# Patient Record
Sex: Female | Born: 1945 | Race: White | Hispanic: No | Smoking: Current every day smoker
Health system: Southern US, Academic
[De-identification: ages and names within clinical notes are randomized; demographics above are authoritative.]

## PROBLEM LIST (undated history)

## (undated) DIAGNOSIS — M549 Dorsalgia, unspecified: Secondary | ICD-10-CM

## (undated) DIAGNOSIS — E782 Mixed hyperlipidemia: Secondary | ICD-10-CM

## (undated) DIAGNOSIS — I739 Peripheral vascular disease, unspecified: Secondary | ICD-10-CM

## (undated) DIAGNOSIS — M7062 Trochanteric bursitis, left hip: Secondary | ICD-10-CM

## (undated) DIAGNOSIS — G8929 Other chronic pain: Secondary | ICD-10-CM

## (undated) DIAGNOSIS — M7061 Trochanteric bursitis, right hip: Secondary | ICD-10-CM

## (undated) HISTORY — DX: Peripheral vascular disease, unspecified (CMS HCC): I73.9

## (undated) HISTORY — PX: HX APPENDECTOMY: SHX54

## (undated) HISTORY — DX: Trochanteric bursitis, right hip: M70.61

## (undated) HISTORY — PX: ESOPHAGOGASTRODUODENOSCOPY: SHX1529

## (undated) HISTORY — DX: Dorsalgia, unspecified: M54.9

## (undated) HISTORY — DX: Mixed hyperlipidemia: E78.2

## (undated) HISTORY — PX: BREAST SURGERY: SHX581

## (undated) HISTORY — DX: Other chronic pain: G89.29

## (undated) HISTORY — PX: HX TUMOR REMOVAL: SHX12

## (undated) HISTORY — DX: Trochanteric bursitis, left hip: M70.62

## (undated) HISTORY — PX: SHOULDER SURGERY: SHX246

---

## 1993-04-22 ENCOUNTER — Other Ambulatory Visit (HOSPITAL_COMMUNITY): Payer: Self-pay

## 2010-03-28 IMAGING — US CV
1 series · 14 of 16 positions shown · non-contrast
Comparison: none

Picasso, Thuy

EXAM:
BILATERAL CAROTID ULTRASOUND
HISTORY: Dizziness

[Series 1: cv · 0.06mm/px · 14 of 58 slices shown]
[im 1/58]
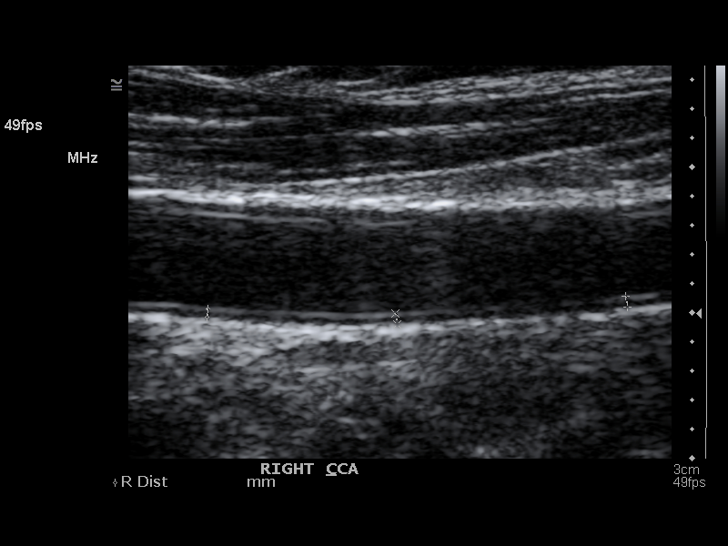
[im 4/58]
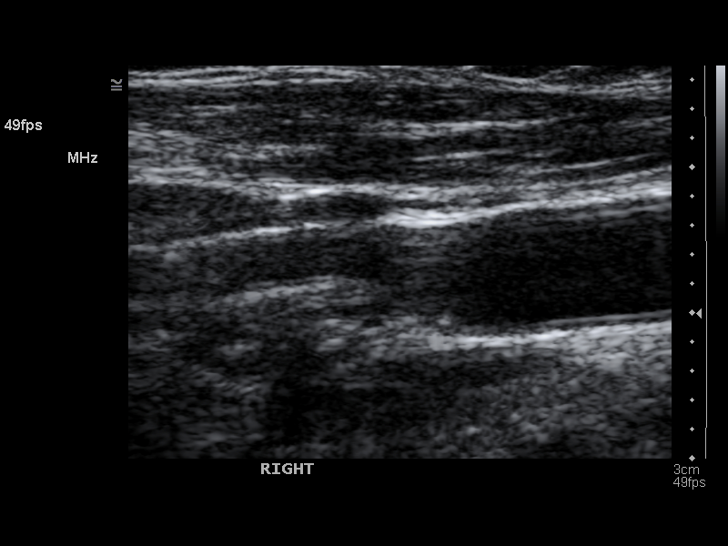
[im 8/58]
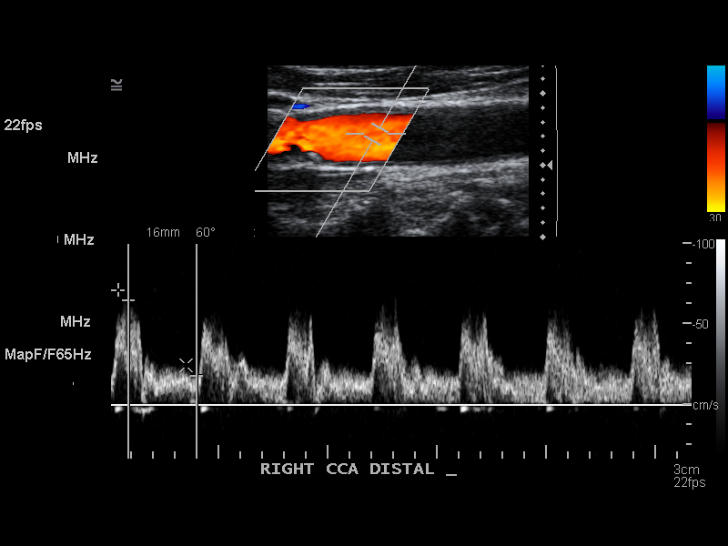
[im 16/58]
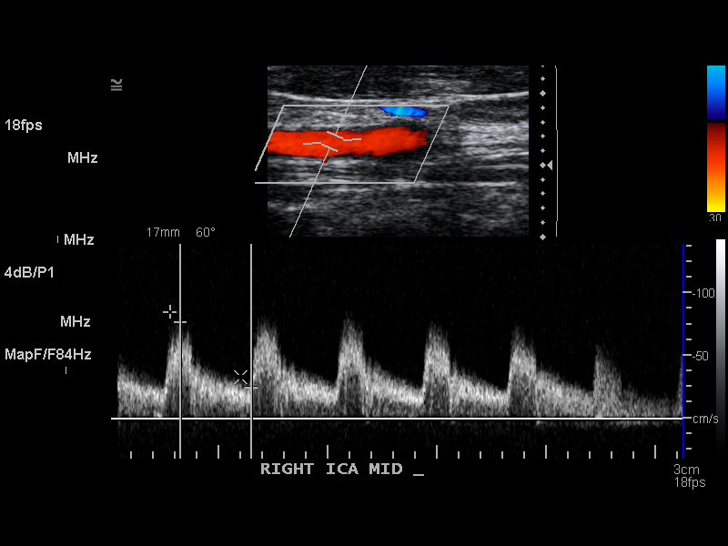
[im 20/58]
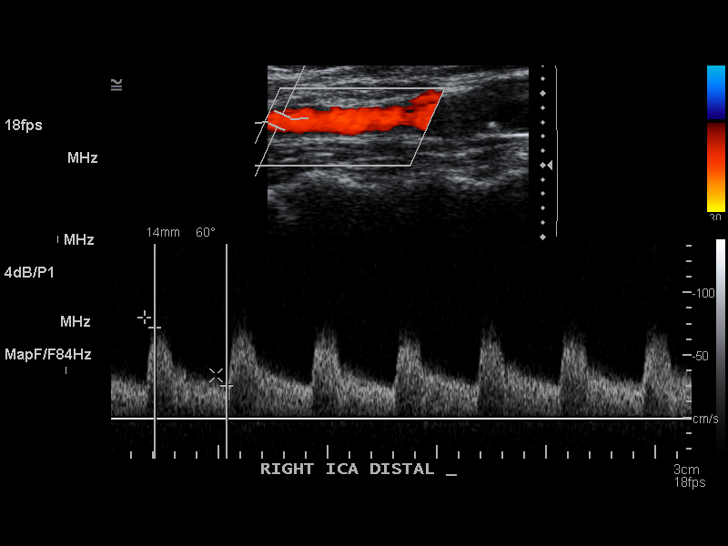
[im 23/58]
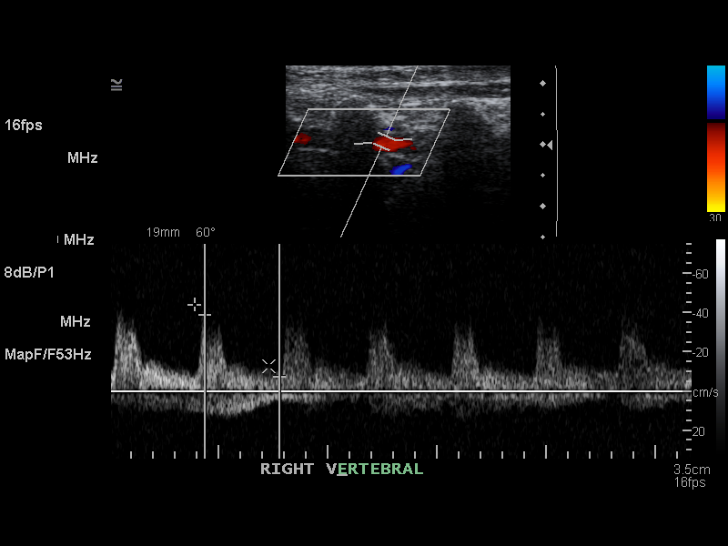
[im 27/58]
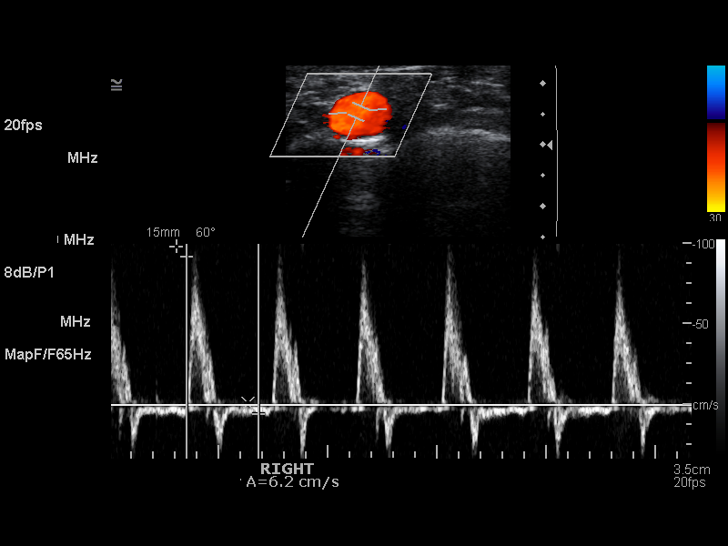
[im 31/58]
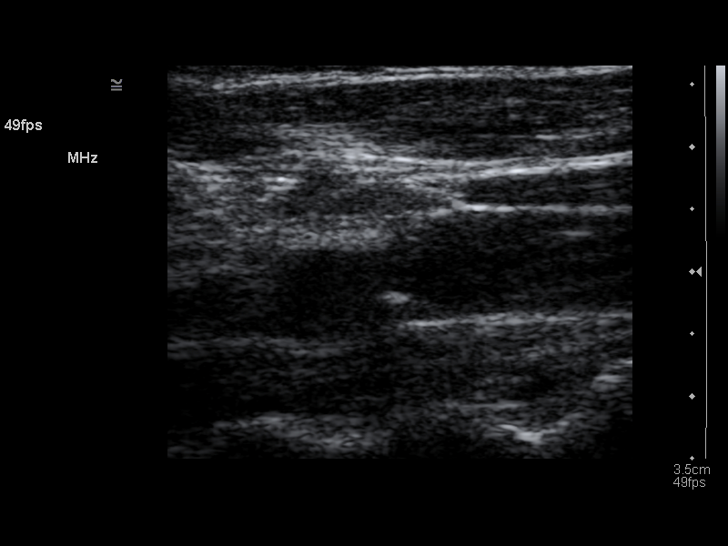
[im 35/58]
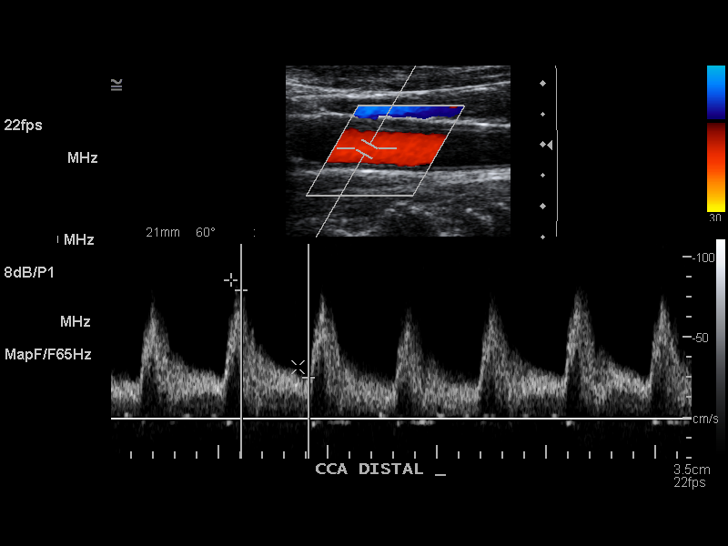
[im 39/58]
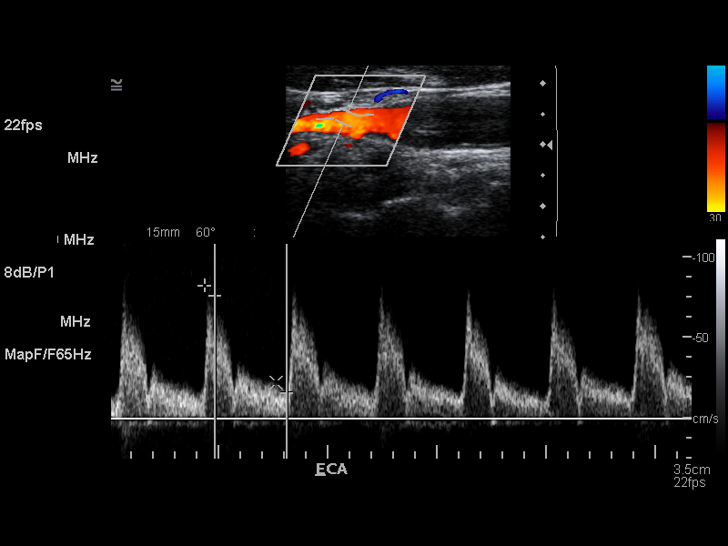
[im 46/58]
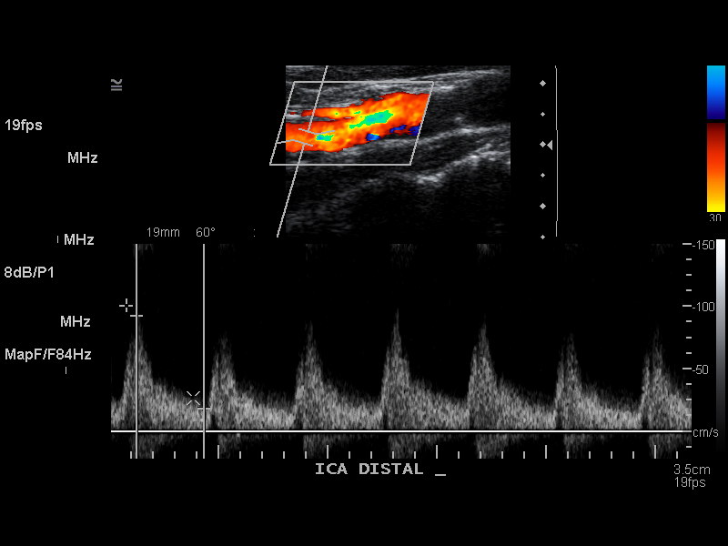
[im 50/58]
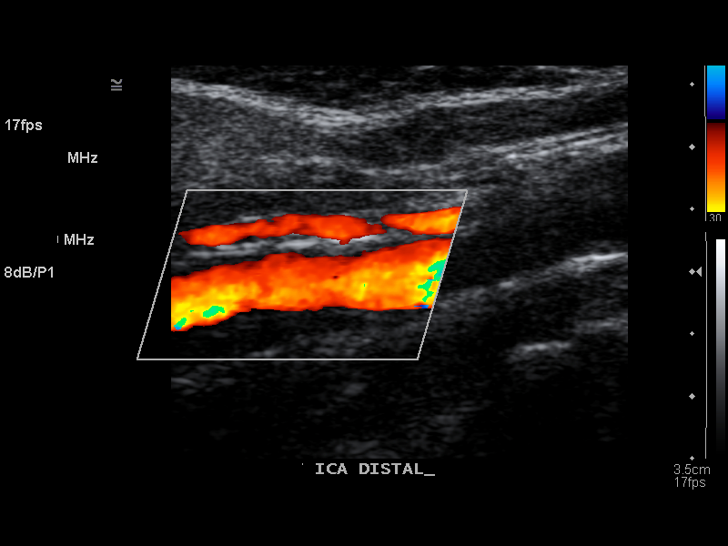
[im 54/58]
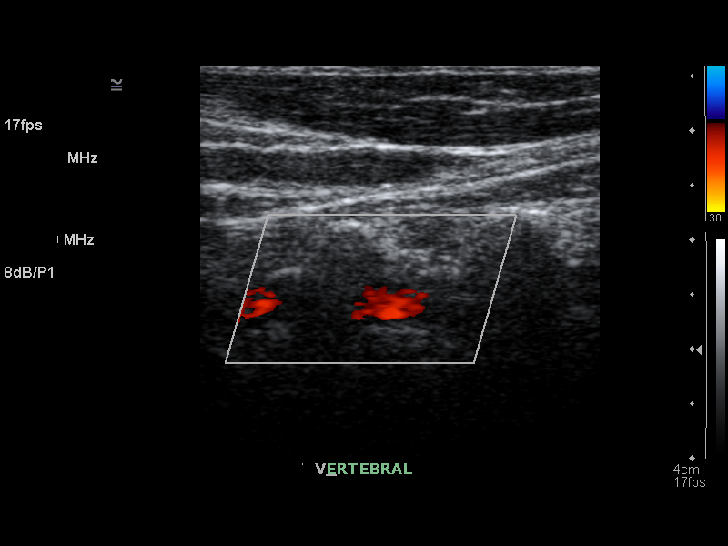
[im 58/58]
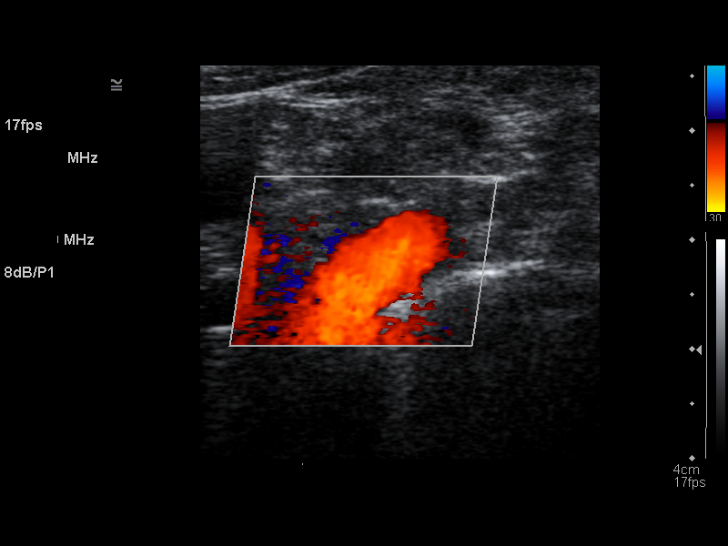

[14 of 16 positions shown; findings below may reference images not displayed]

FINDINGS: Examination of the carotid arteries reveals a small amount of heterogeneous plaque in the region of the carotid bifurcations bilaterally. However, no significant associated stenosis or flow velocity elevation is seen on either side. Antegrade flow is noted within the vertebral arteries bilaterally.
IMPRESSION: 1.  Mild atherosclerotic disease. 
2.  No significant carotid stenosis.  

________________________________

## 2012-04-12 IMAGING — MG MAMMO SCREEN W CAD
1 series · 5 of 5 positions shown · non-contrast
Comparison: Previous outside studies dated 12/16/07 and 03/15/10.

Higino, Orlando Rodrigues

Exam:
Bilateral digital screening mammogram with CAD
HISTORY: Asymptomatic 66 year old with family history of breast cancer in her sister. Lifetime breast cancer risk is calculated at 14% compared with 8% in control group.

[Series 2: R CC · right · 5 of 5 slices shown]
[im 1/5]
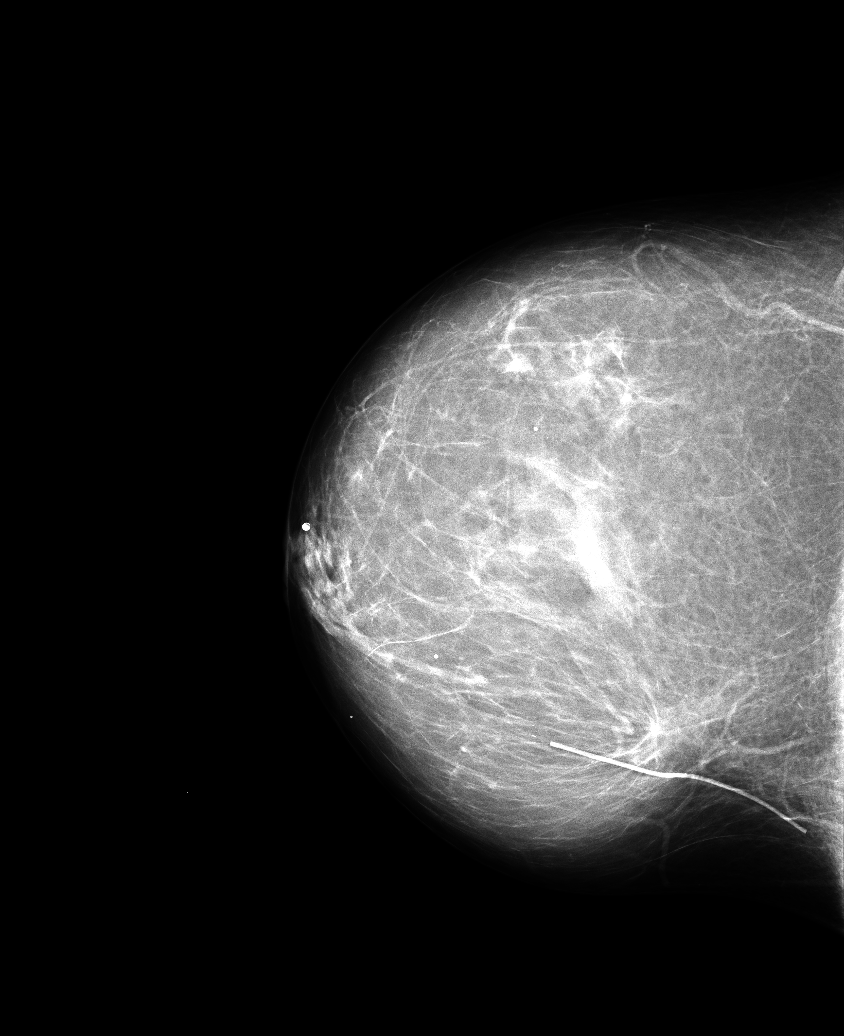
[im 2/5]
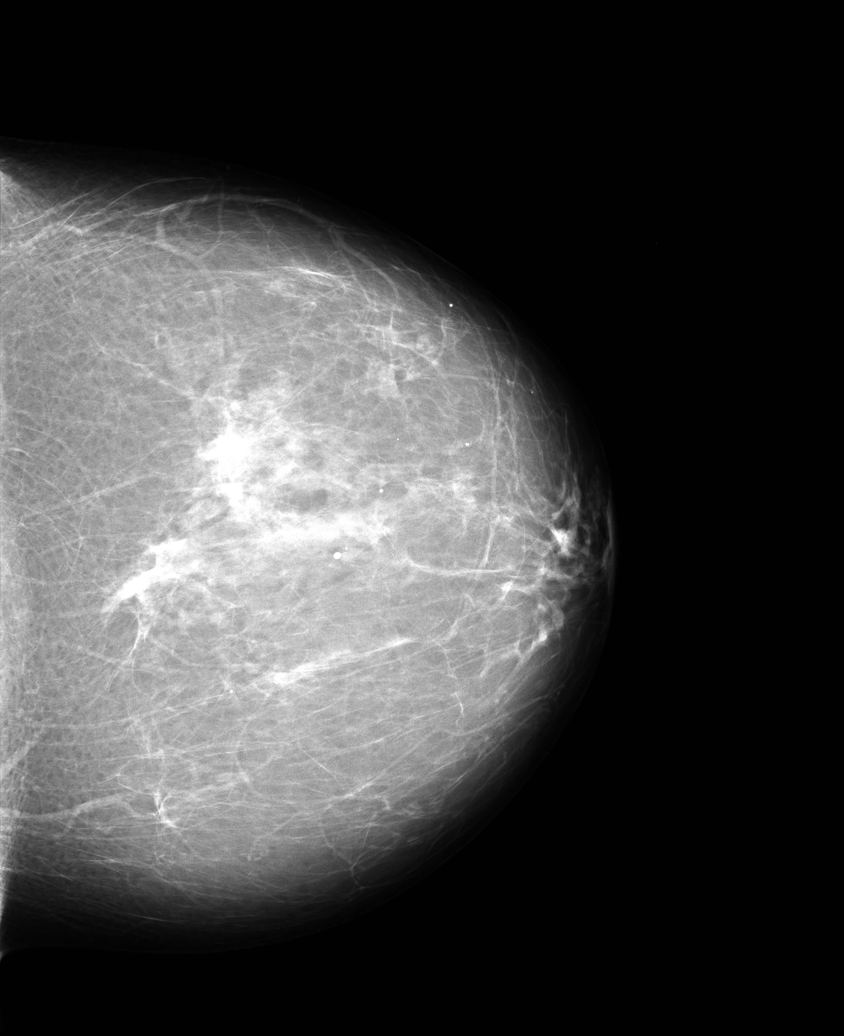
[im 3/5]
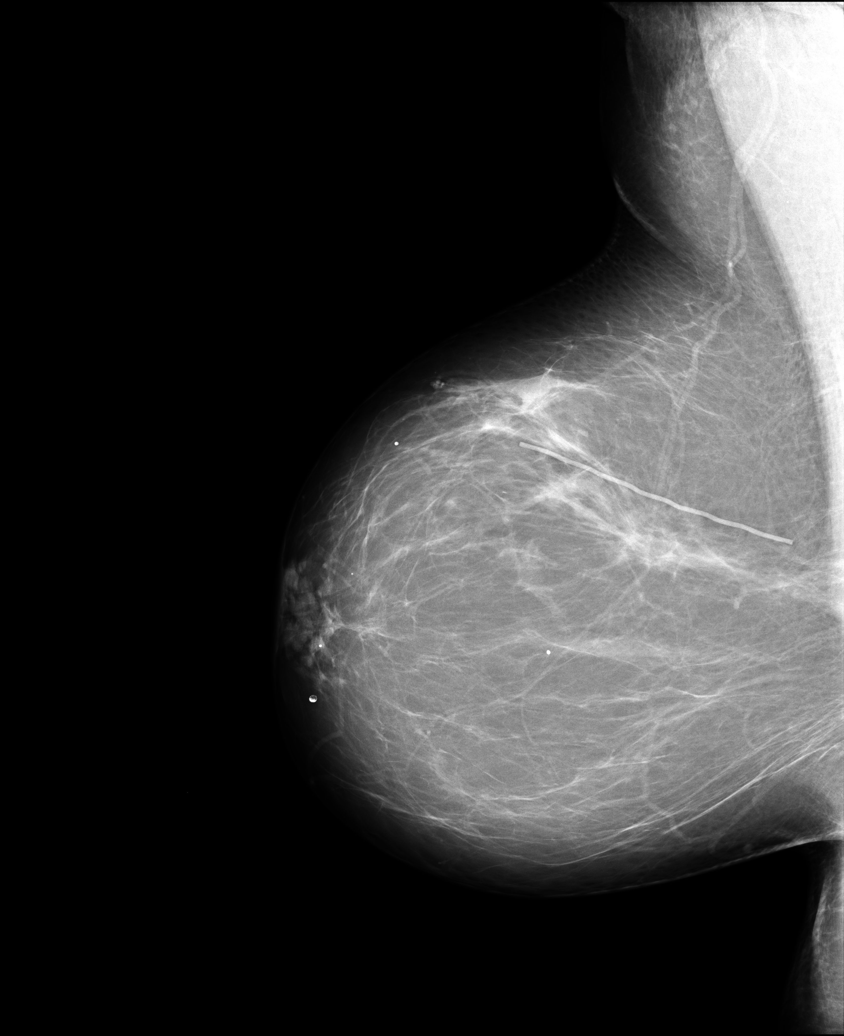
[im 4/5]
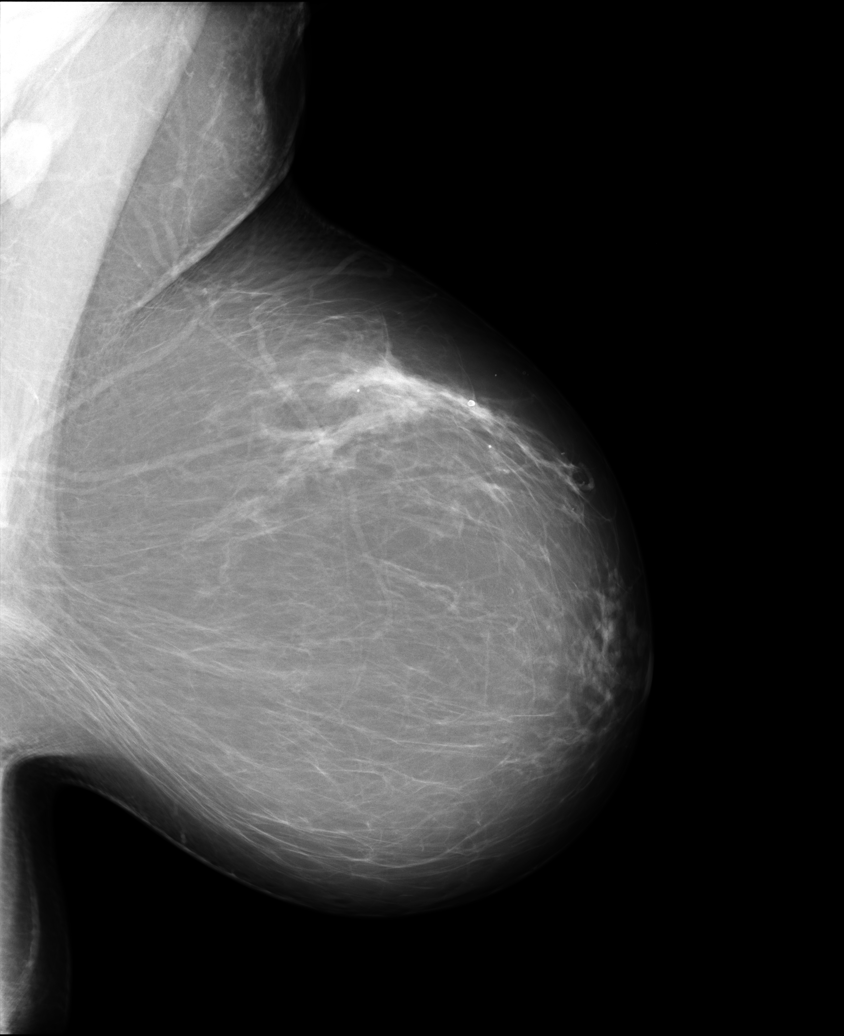
[im 5/5]
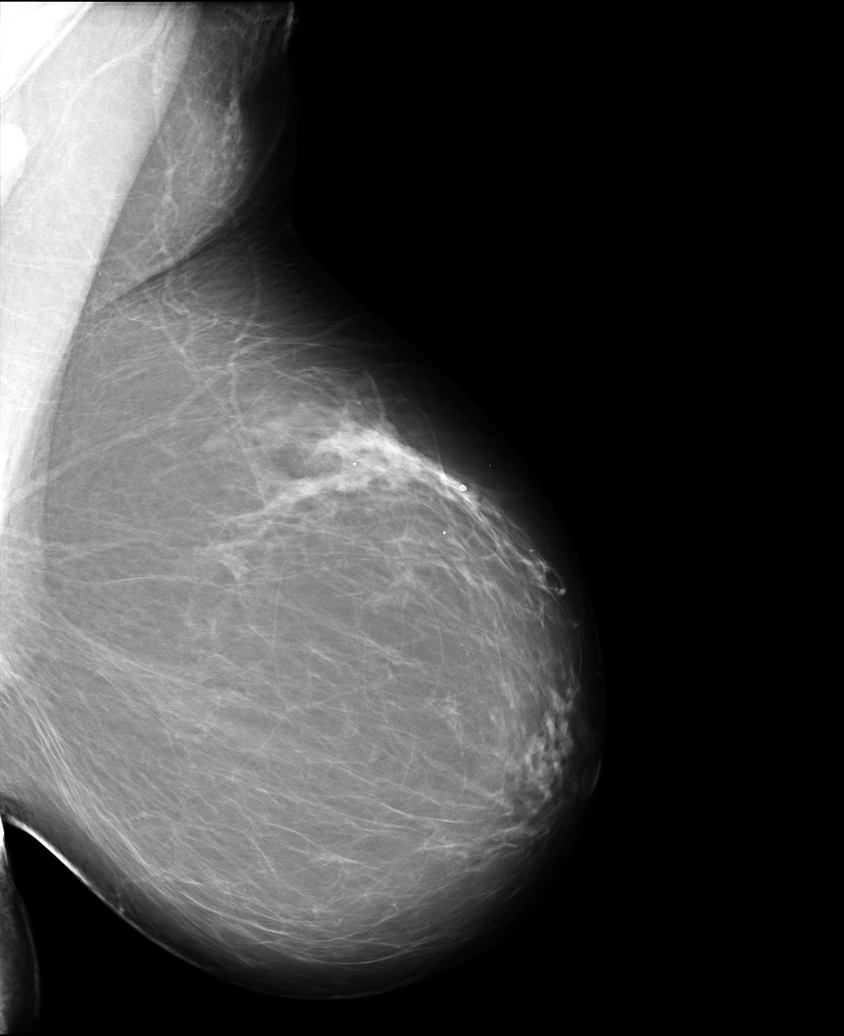

[5 of 5 positions shown; findings below may reference images not displayed]

FINDINGS: Biopsy changes of the medial right breast are noted with no recurrent masses. No abnormal calcific density, skin changes or nipple changes are seen. 

NOTE:

In compliance with Federal regulations, the results of this mammogram are being sent to the patient.
IMPRESSION: Benign mammographic findings with post biopsy changes of the right breast. 
Clinical follow up and mammographic follow up are recommended at twelve months. 
Final Assessment Code:
Bi-Rads 2 

BI-RADS 0
Need additional imaging evaluation
BI-RADS 1
Negative mammogram
BI-RADS 2
Benign finding
BI-RADS 3
Probably benign finding: short-interval follow-up suggested
BI-RADS 4
Suspicious abnormality:  biopsy should be considered
BI-RADS 5
Highly suggestive of malignancy; appropriate action should be taken

________________________________
Amnon Tiger., signed this document electronically

## 2012-05-22 IMAGING — CR CSP
1 series · 5 of 5 positions shown · non-contrast
Comparison: None.

XRay Cervical Sp.2-3 Views BERTSCH

Dhenazta, Nhamuk
Exam:
Cervical Spine 3V
INDICATION: Left arm pain.

[Series 1: view not recorded · 0.17mm/px · 5 of 5 slices shown]
[im 1/5]
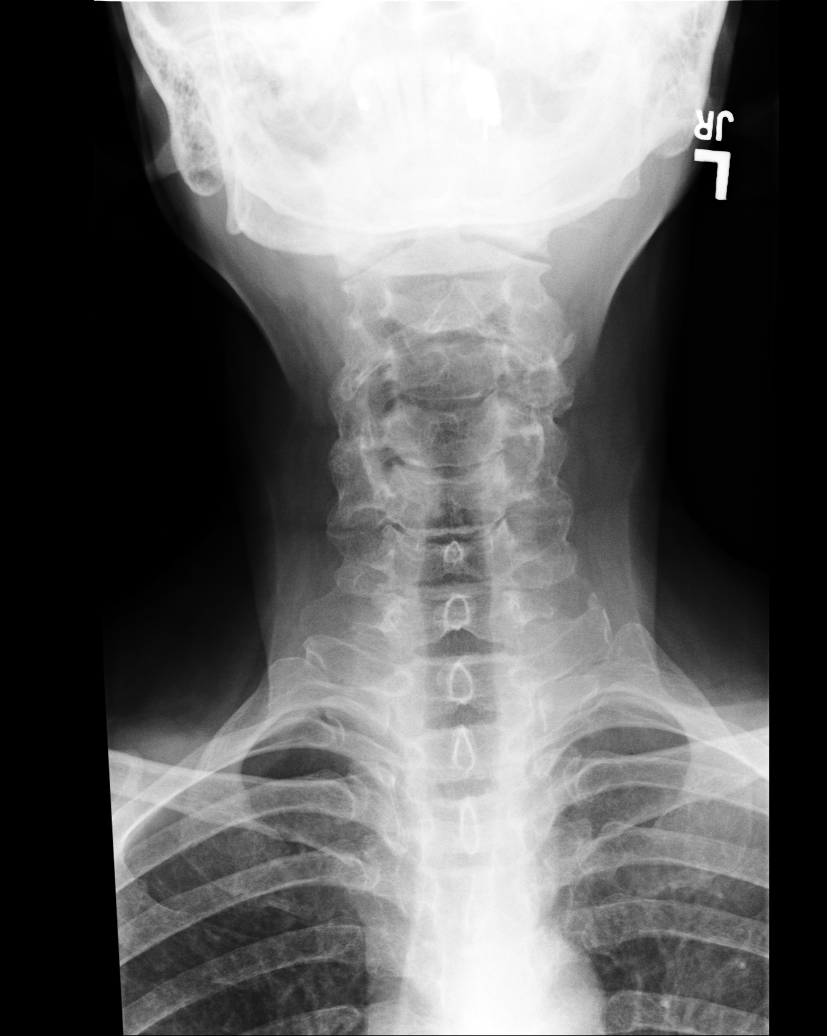
[im 2/5]
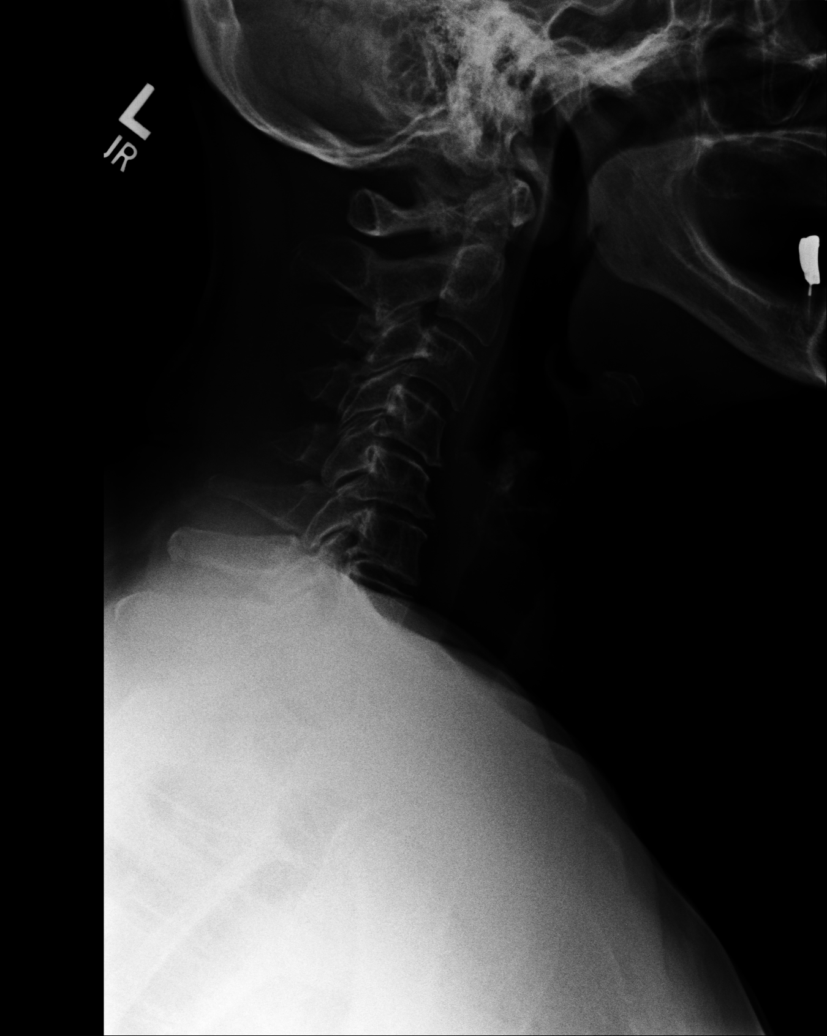
[im 3/5]
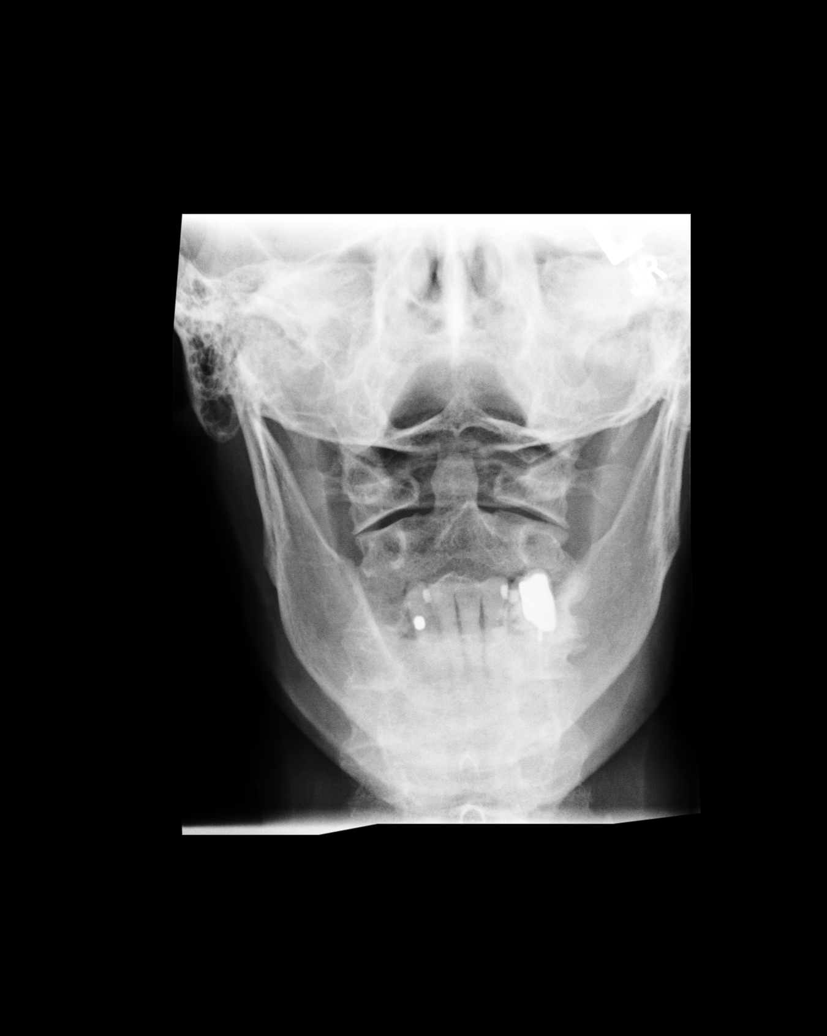
[im 4/5]
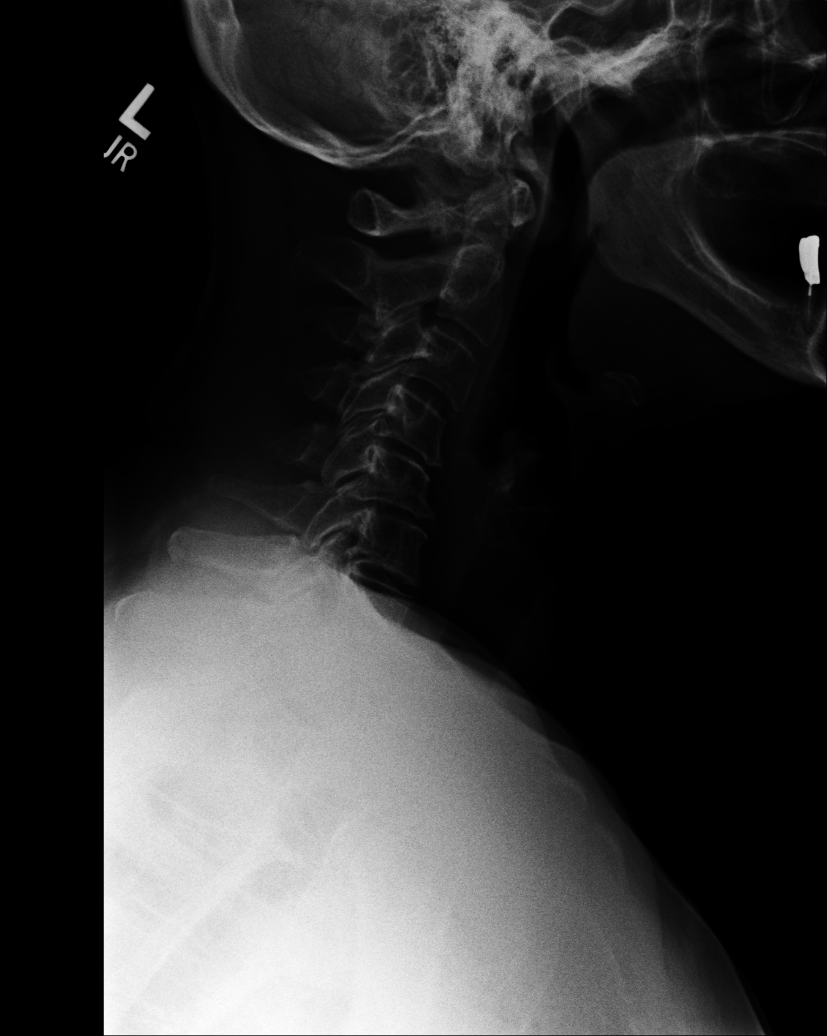
[im 5/5]
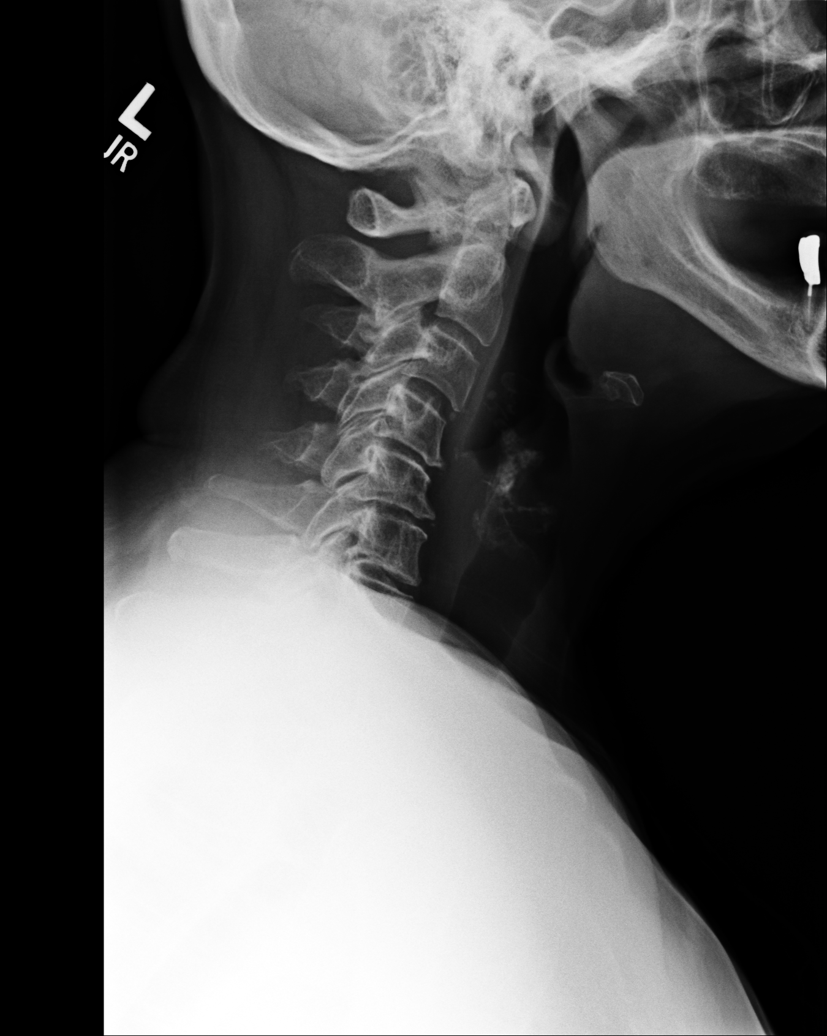

[5 of 5 positions shown; findings below may reference images not displayed]

FINDINGS: There is mild reversal of normal cervical lordosis, this is likely positional. There is no evidence of an acute fracture or subluxation. Mild disc space narrowing is noted at C4-5 and C5-6 levels. There is no prevertebral soft tissue swelling.
IMPRESSION: Reversal of normal cervical lordosis with mild disc space narrowing at C4-5 and C5-6 levels. There is no evidence of an acute fracture or subluxation. Please consider further evaluation with MRI for persistent or worsening symptoms. 

________________________________

## 2015-10-12 IMAGING — CR XRAY ABDOMEN 1 AP VIEW
1 series · 2 of 2 positions shown · non-contrast
Comparison: None.

Exam:   
Abdomen 2V
HISTORY: Hematuria.

[Series 2: view not recorded · oblique · 0.17mm/px · 2 of 2 slices shown]
[im 1/2]
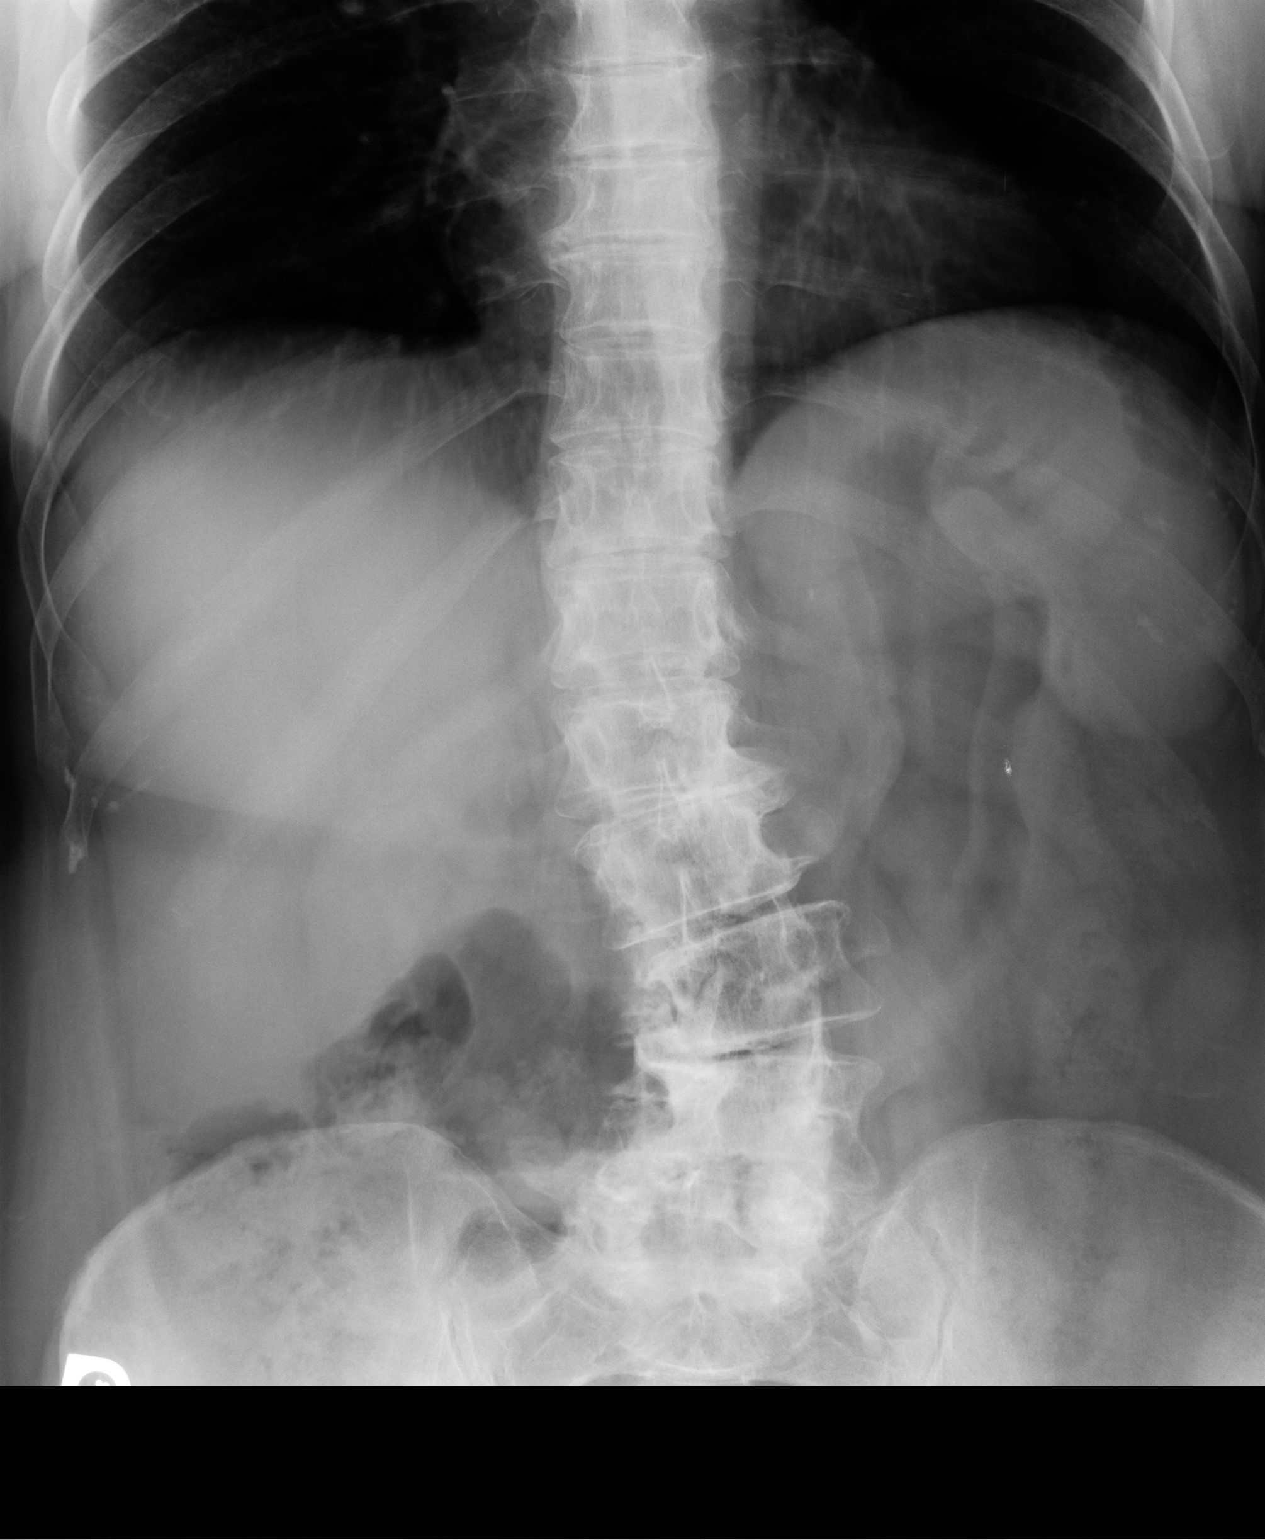
[im 2/2]
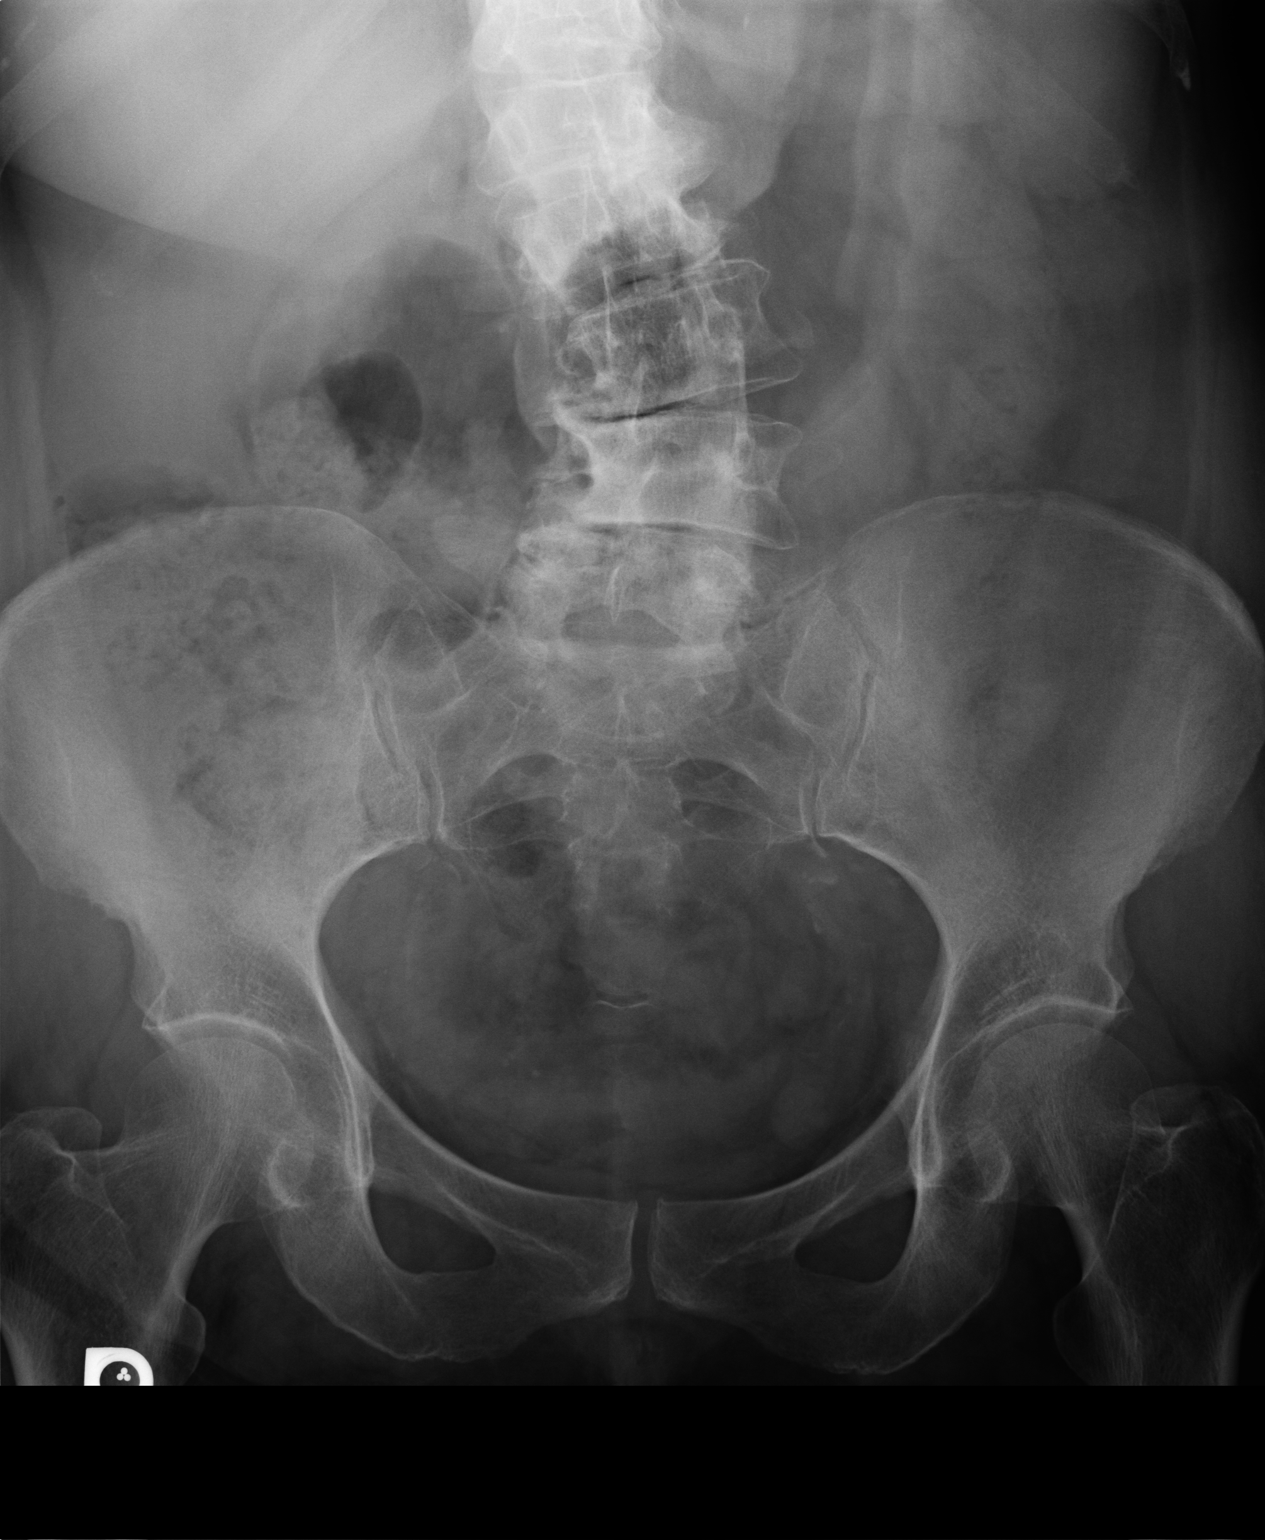

[2 of 2 positions shown; findings below may reference images not displayed]

FINDINGS: Multiple corticated collections overlie the mid and inferior pole of the left kidney, largest measuring approximately 5 mm. Suboptimal assessment of the right kidney secondary to overlying bowel gas. There is levocurvature of the lumbar spine with rotatory component. Moderate to severe facet disease of the L4-5 and L5-S1 interval.
IMPRESSION: 1.
Suspect left nephrolithiasis. Ultrasound better to evaluate hydronephrosis.

## 2016-05-15 IMAGING — MG 3D SCREENING DIGITAL MAMMOGRAM WITH CAD
5 series · 7 of 24 positions shown · non-contrast
Comparison: 

------------- REPORT [id number] -------------
Exam:  

Annual screening digital mammogram with 3D Tomosynthesis with CAD
INDICATION: Screening.

[Series 6676: R CC · right · 0.10mm/px · 2 of 2 slices shown]
[im 1/2]
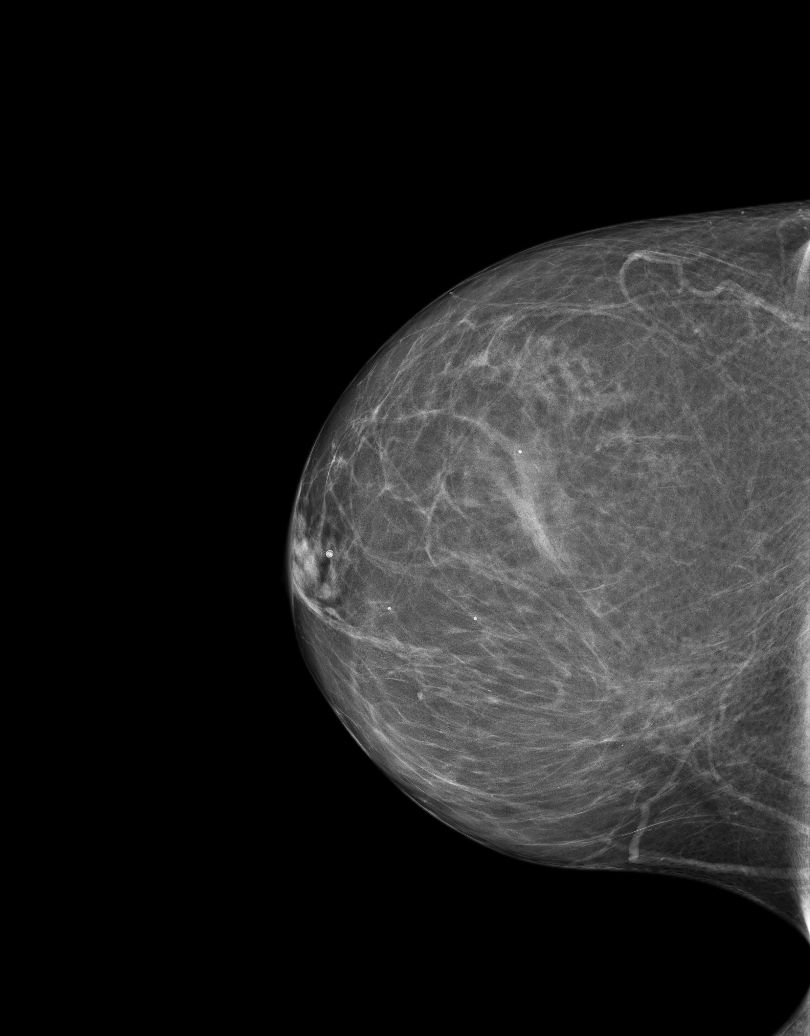
[im 2/2]
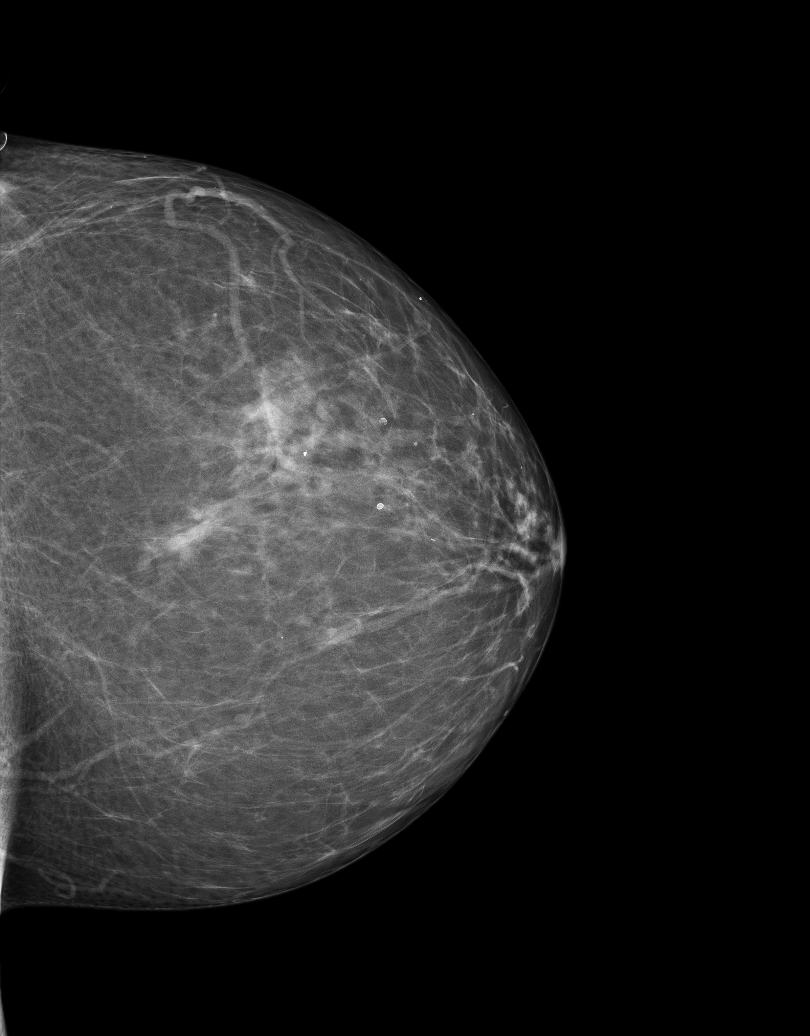

[Series 6678: 3D SCREENING DIGITAL MAMMOGRAM WITH CAD · 2 acquisitions, 2 frames shown (1 of 2)]
[im 1/2]
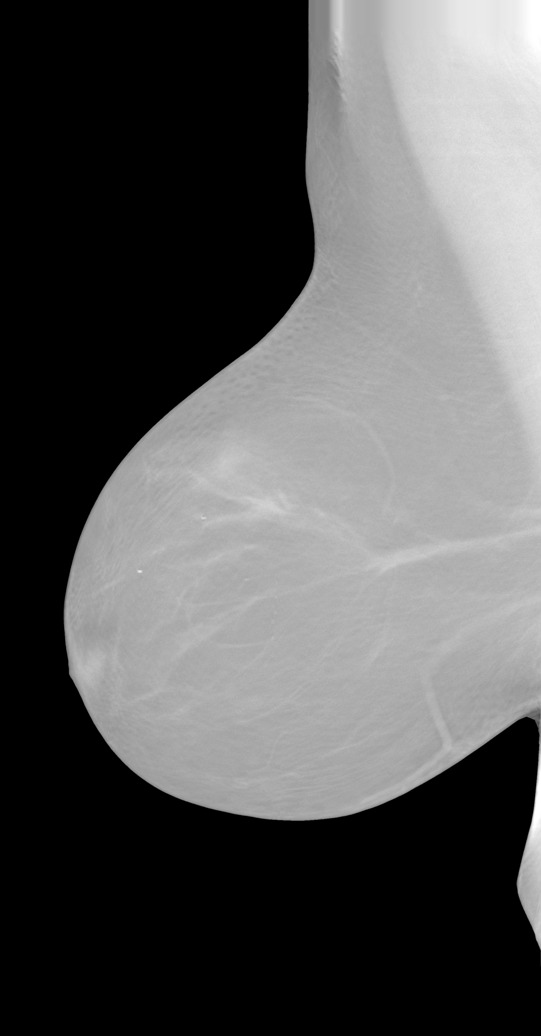
[im 2/2]
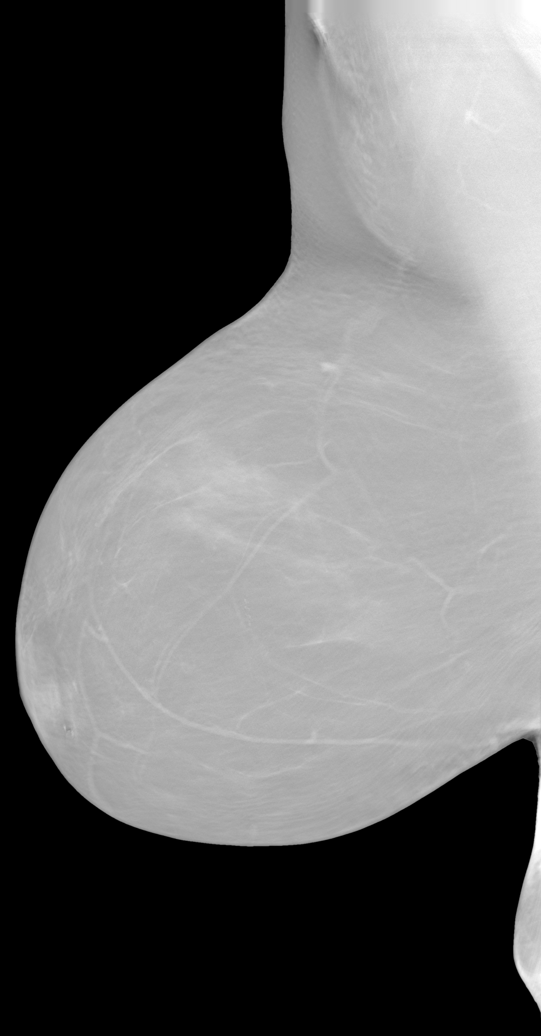

[R]
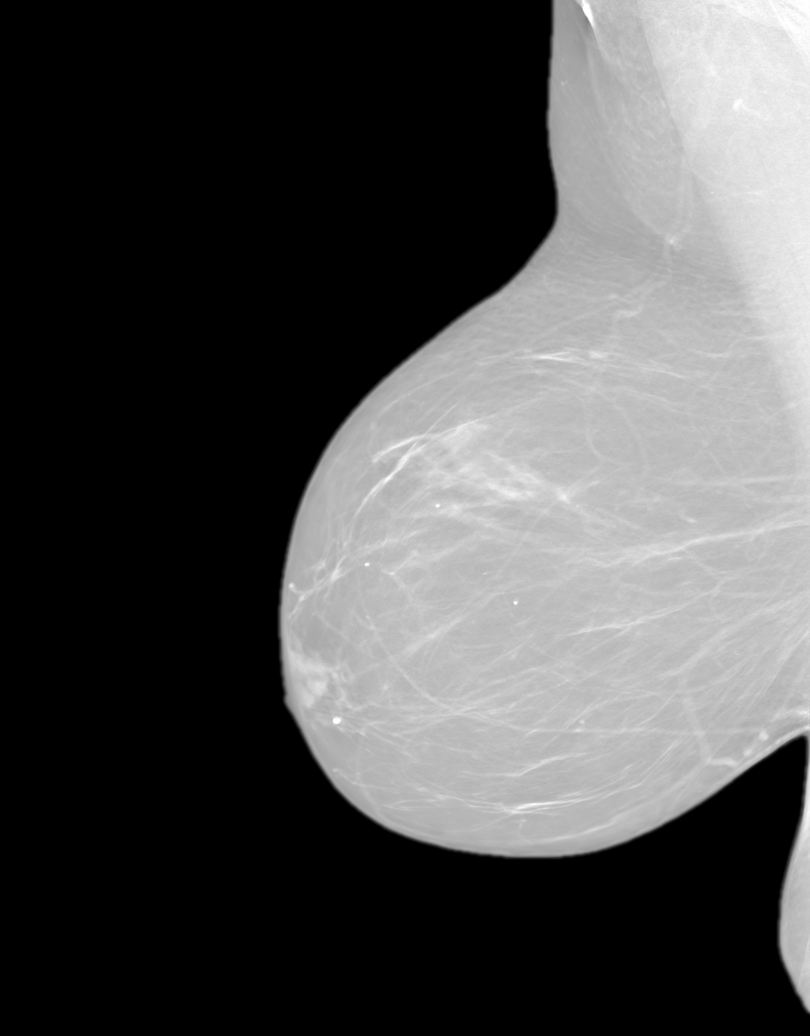

[3D SCREENING DIGITAL MAMMOGRAM WITH CAD (2 of 2) · tomo slice 15/92.0]
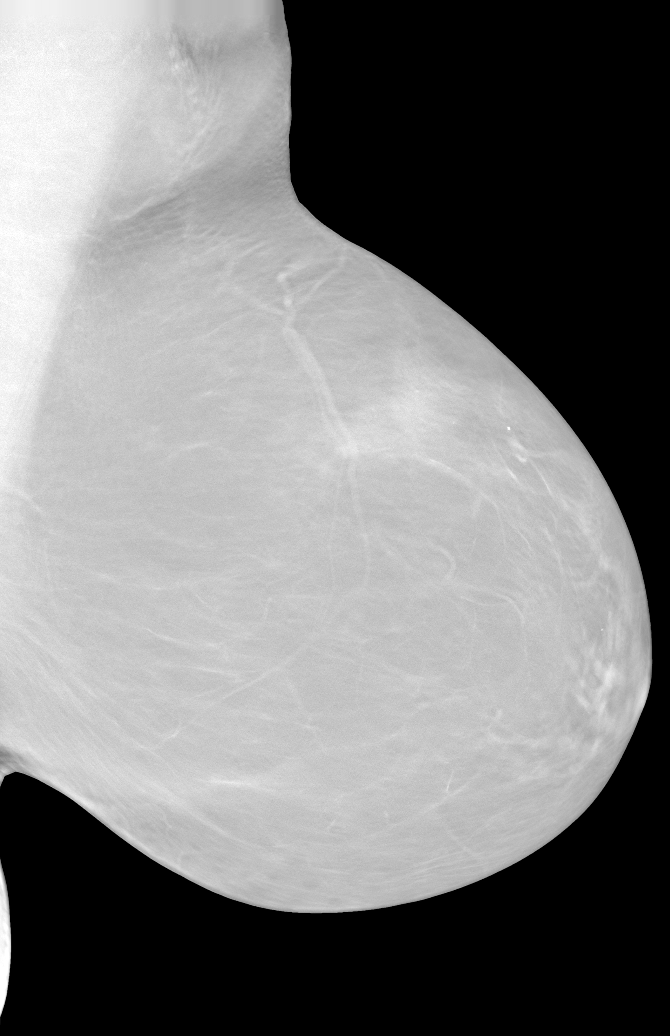

[L]
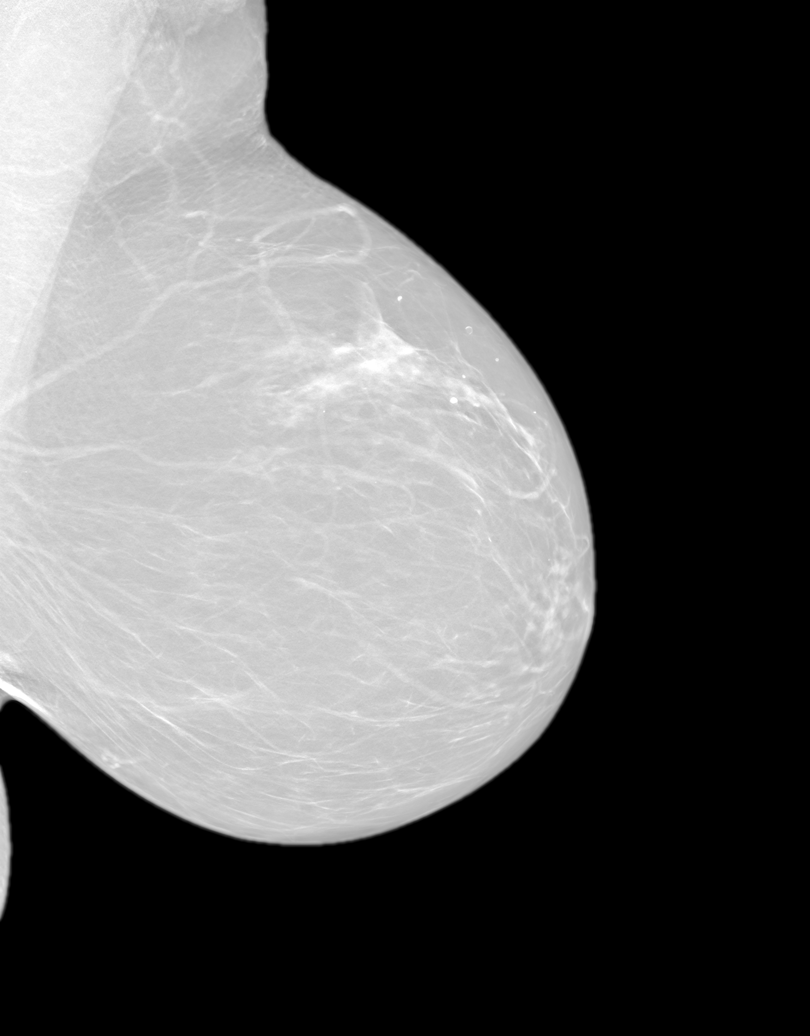

[7 of 24 positions shown; findings below may reference images not displayed]

FINDINGS: There are scattered fibroglandular elements. There is no mass or suspicious cluster of microcalcifications. There is no architectural distortion, skin thickening or nipple retraction.
IMPRESSION: BIRADS 2-Benign findings. Patient has been added in a reminder system with a

target date for the next screening mammography.

DENSITY CODE –  B (Scattered areas of fibroglandular density). 

Final Assessment Code:

Bi-Rads 2 

BI-RADS 0
Need additional imaging evaluation

BI-RADS 1
Negative mammogram

BI-RADS 2
Benign finding

BI-RADS 3
Probably benign finding: short-interval follow-up suggested

BI-RADS 4
Suspicious abnormality:  biopsy should be considered

BI-RADS 5
Highly suggestive of malignancy; appropriate action should be taken

BI-RADS 6
Known Biopsy-proven Malignancy – Appropriate action should be taken

NOTE:
In compliance with Federal regulations, the results of this mammogram are being sent to the patient.

------------- REPORT [id number] -------------
[facility] [place]
[address]
Daina Ms.RUPERT, SAYALI:
We wish to report the following on your recent mammography examination. We are sending a report to your referring physician or other health care provider. 
(       Normal/Negative:
No evidence of cancer.
This statement is mandated by the [place], Department of Health.
Your examination was performed by one of our technologists, who are registered radiological technologists and also specially certified in mammography:
___
Parlak, Edaly (M)
___
Dang, Mcalex (M)

Your mammogram was interpreted by our radiologist.

( 
Sofeine Made, M.D.

(Annual Breast Examination by a physician or other health care provider
(Annual Mammography Screening beginning at age 40
(Monthly Breast Self Examination

## 2016-10-17 IMAGING — CR XRAY ABDOMEN 1 AP VIEW
1 series · 2 of 2 positions shown · non-contrast
Comparison: KUB abdomen dated 10/12/2015.

Exam:   

KUB abdomen 1V
INDICATION: Followup left renal stone.

[Series 3: view not recorded · 0.17mm/px · 2 of 2 slices shown]
[im 1/2]
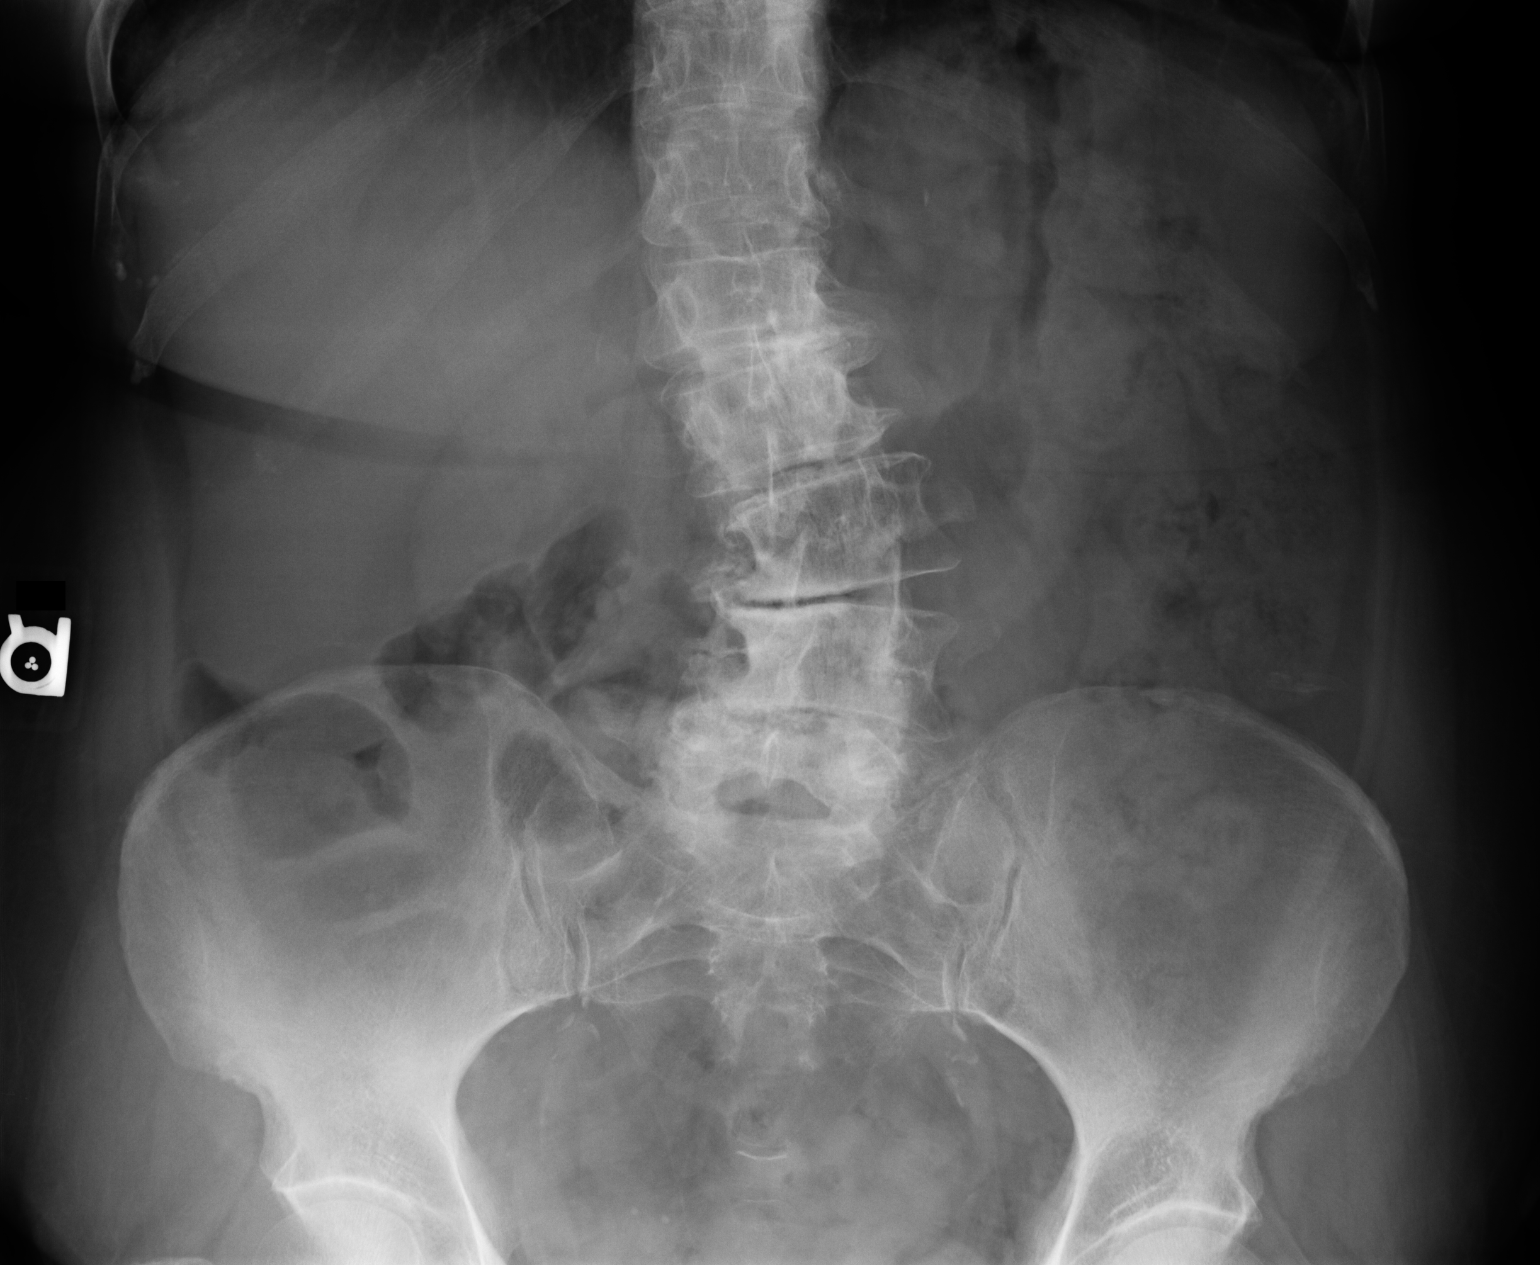
[im 2/2]
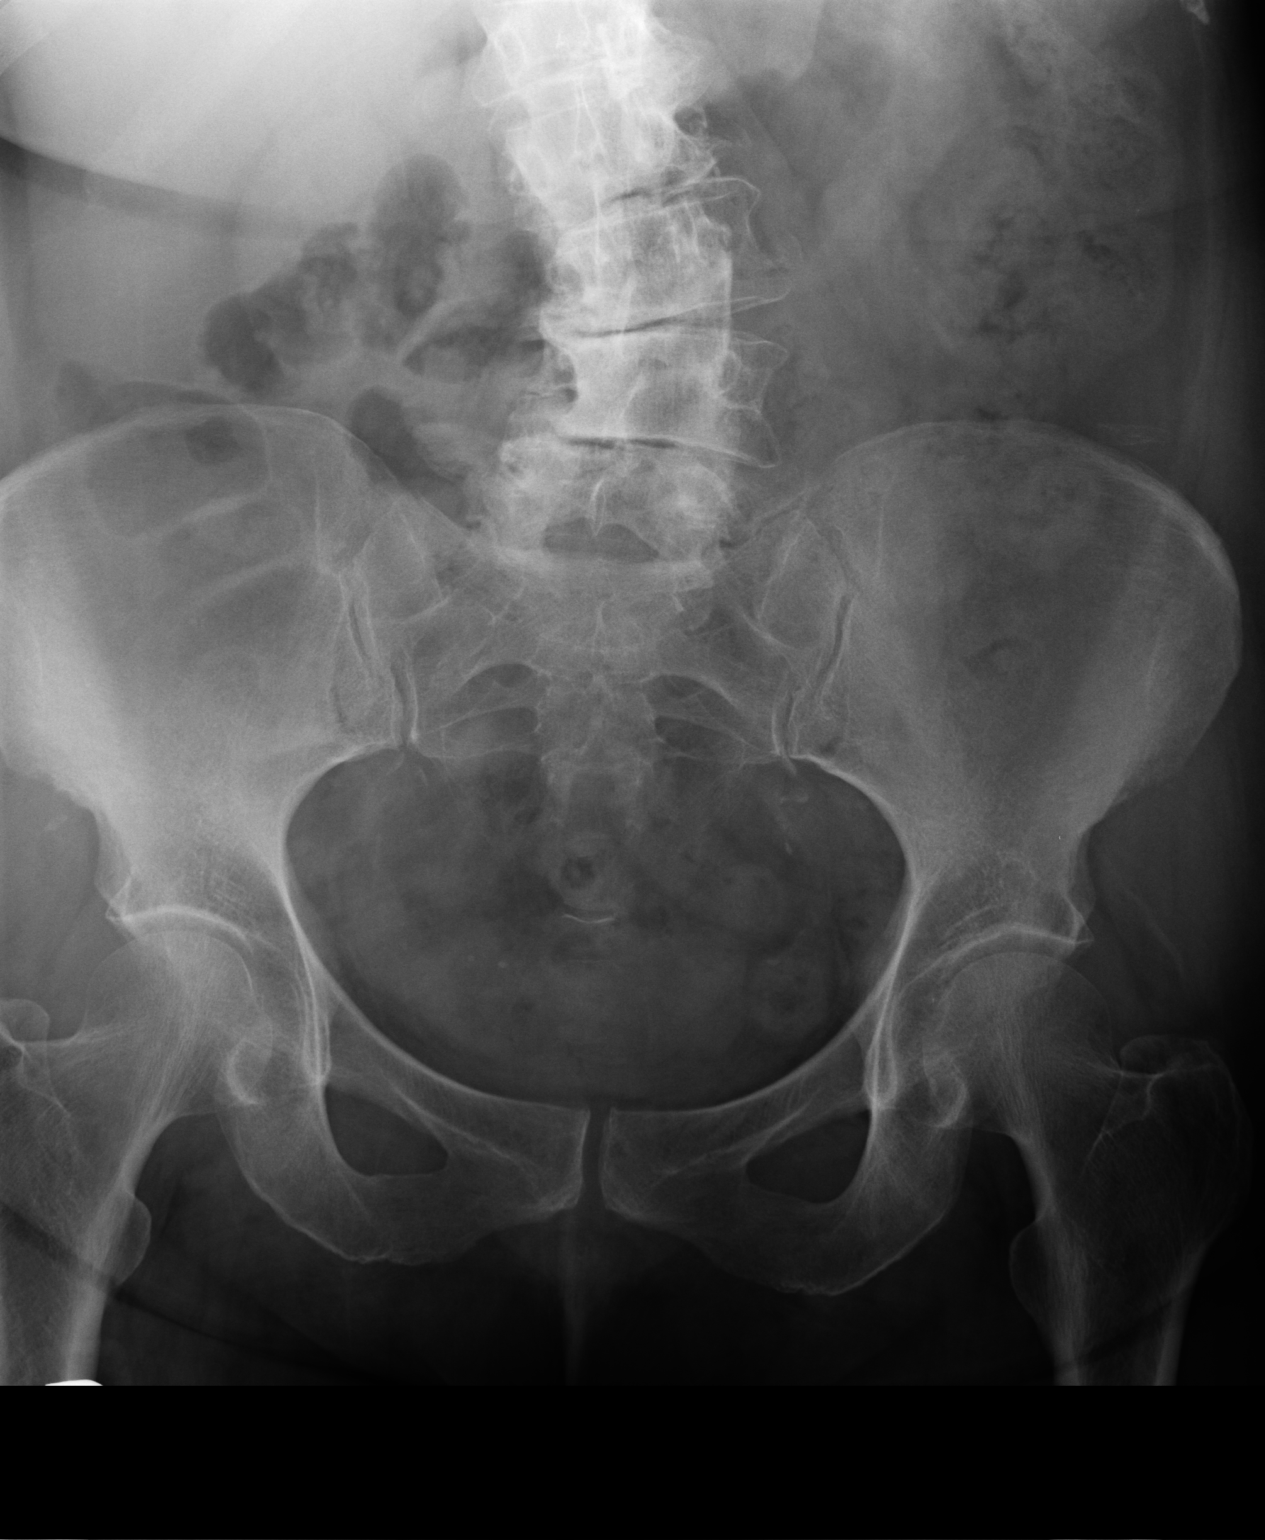

[2 of 2 positions shown; findings below may reference images not displayed]

FINDINGS: Previously seen left renal calculi are not well visualized due to artifact from overlying stool and bowel gas. Punctate calcification within the right hemipelvis is stable and may be vascular. Scattered arterial calcifications are also noted. Advanced degenerative changes within the lumbar spine are also again seen.
IMPRESSION: Unremarkable limited exam. Please consider further evaluation with CT scan for persistent or worsening symptoms.

## 2017-04-13 IMAGING — CR XRAY CERVICAL SPINE [DATE] VIEWS
1 series · 3 of 3 positions shown · non-contrast
Comparison: none

Exam:   

Cervical spine 3V
INDICATION: Neck pain.

[Series 2: view not recorded · 0.17mm/px · 3 of 3 slices shown]
[im 1/3]
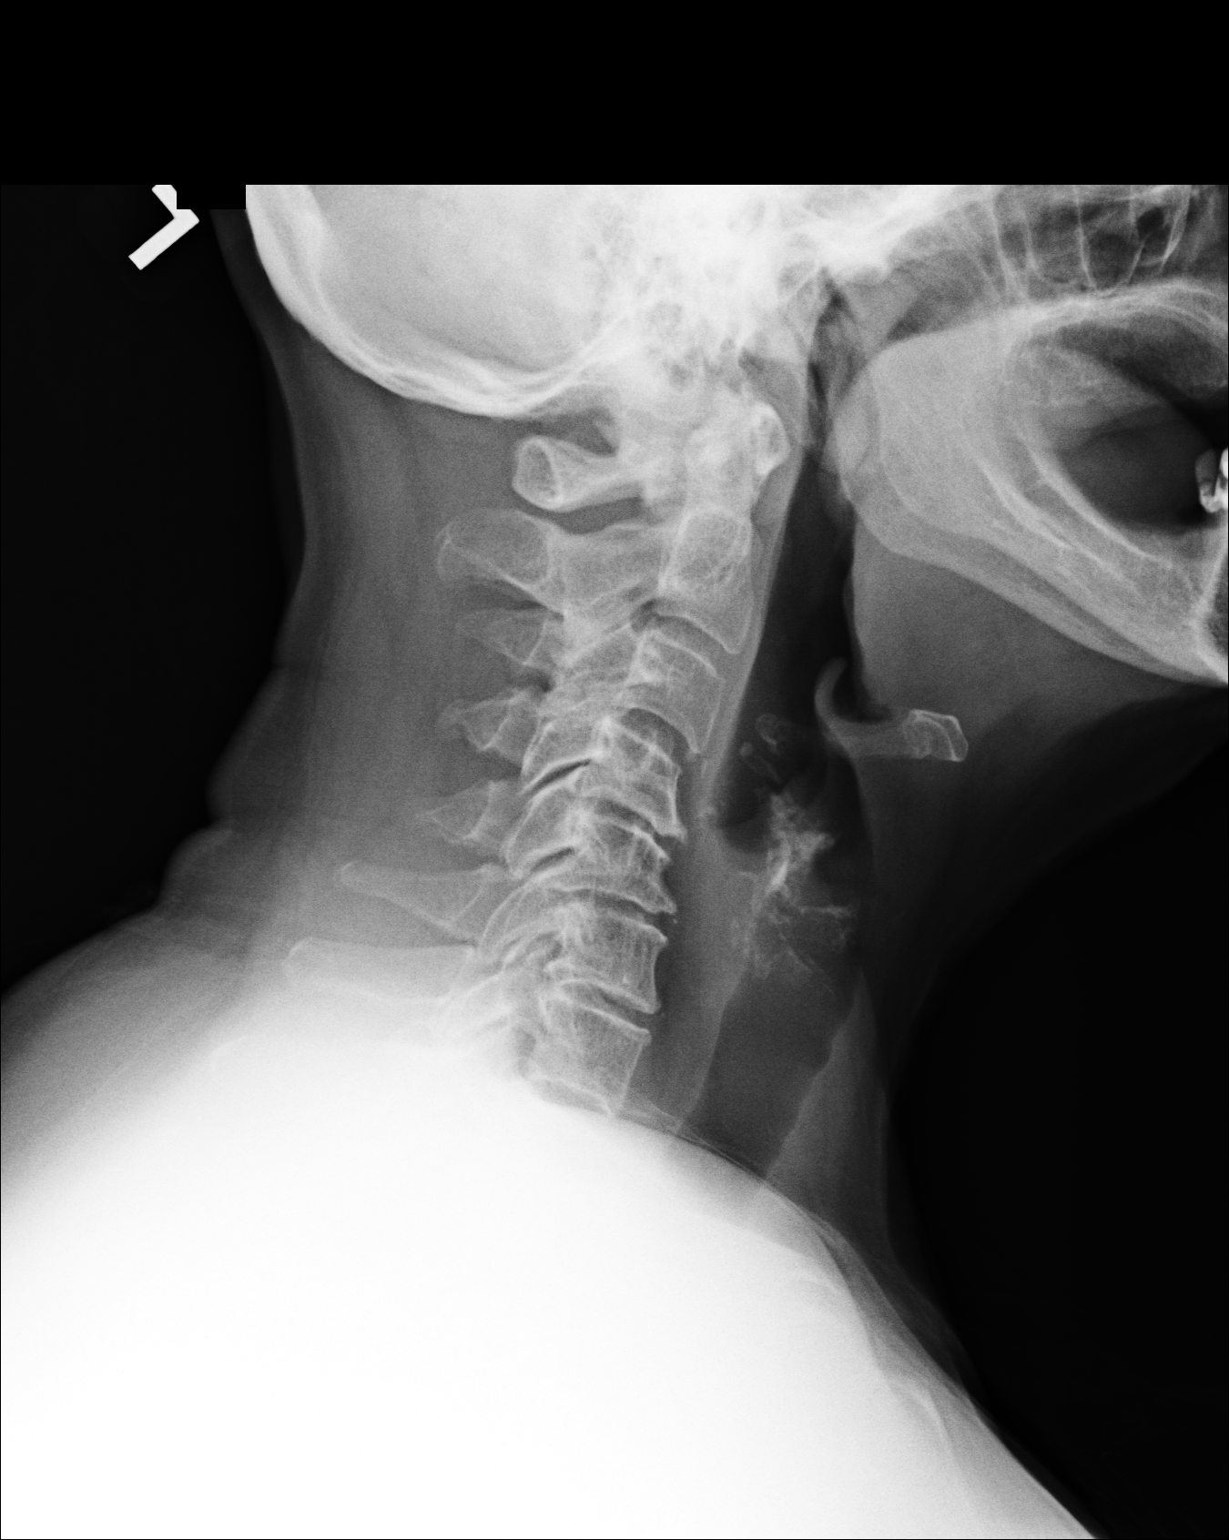
[im 2/3]
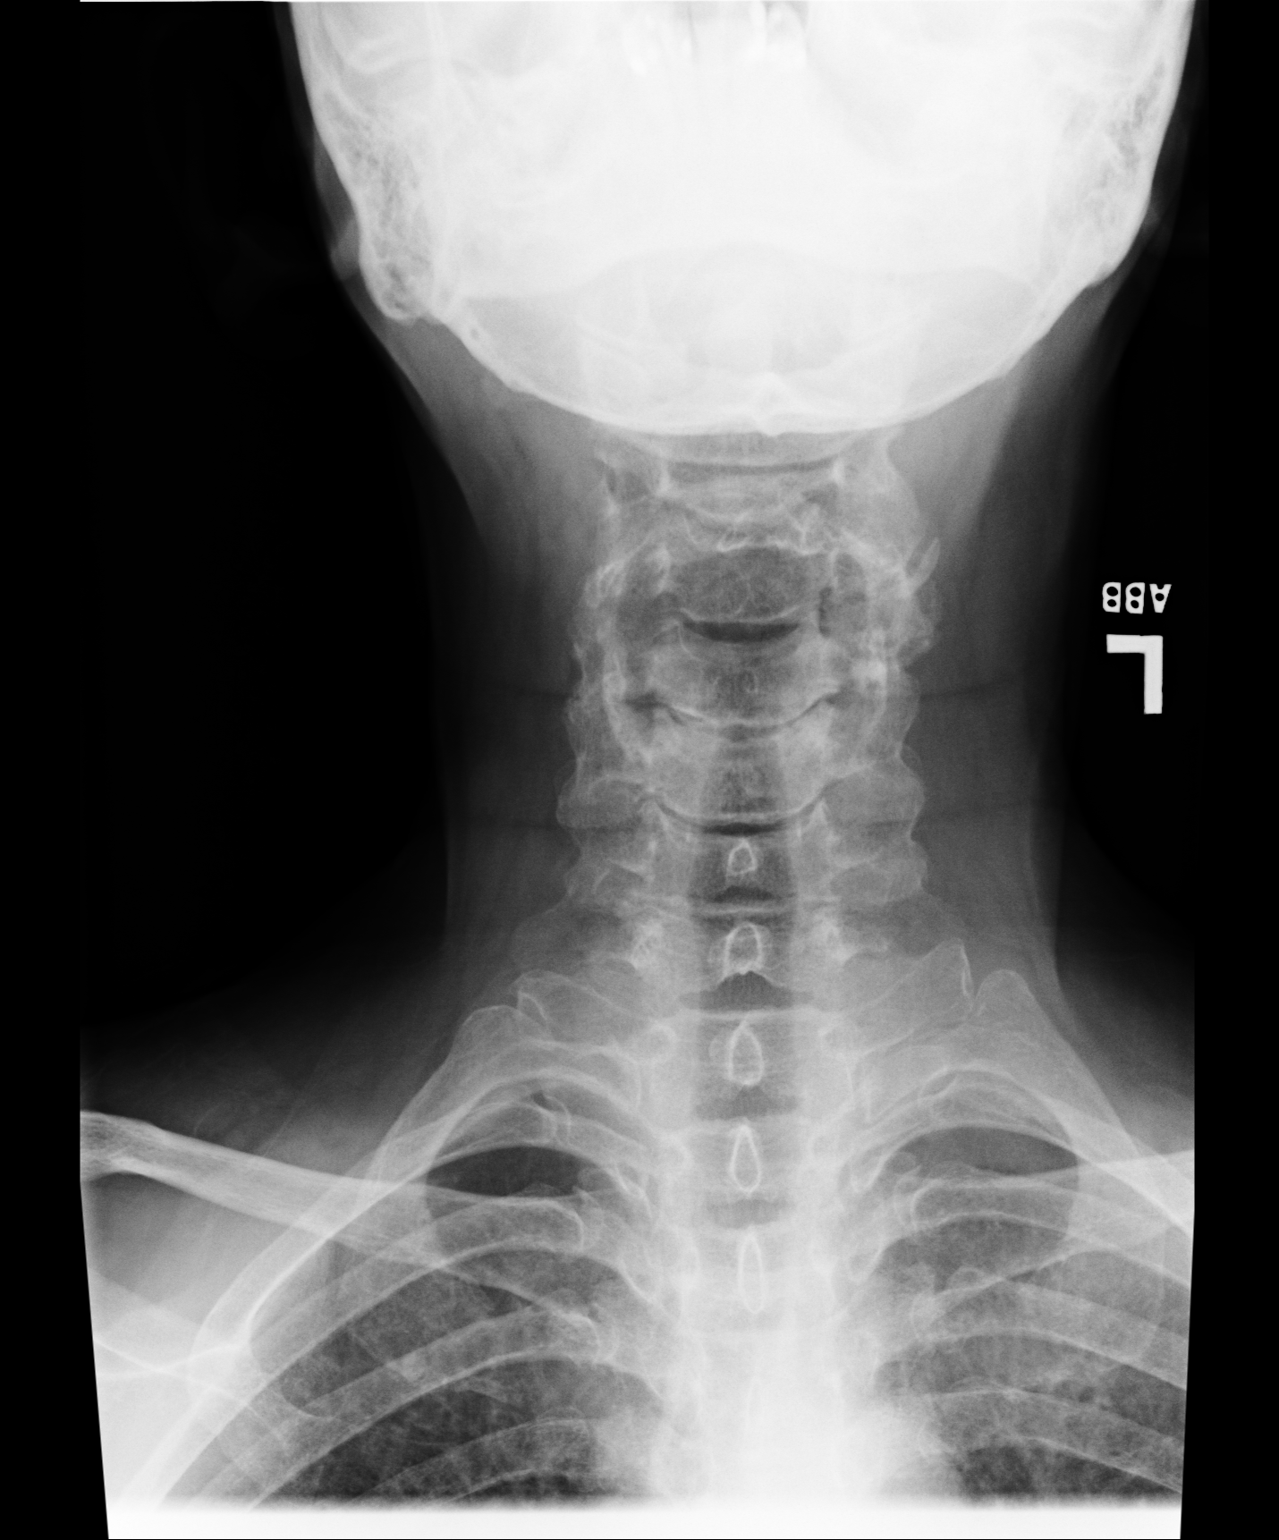
[im 3/3]
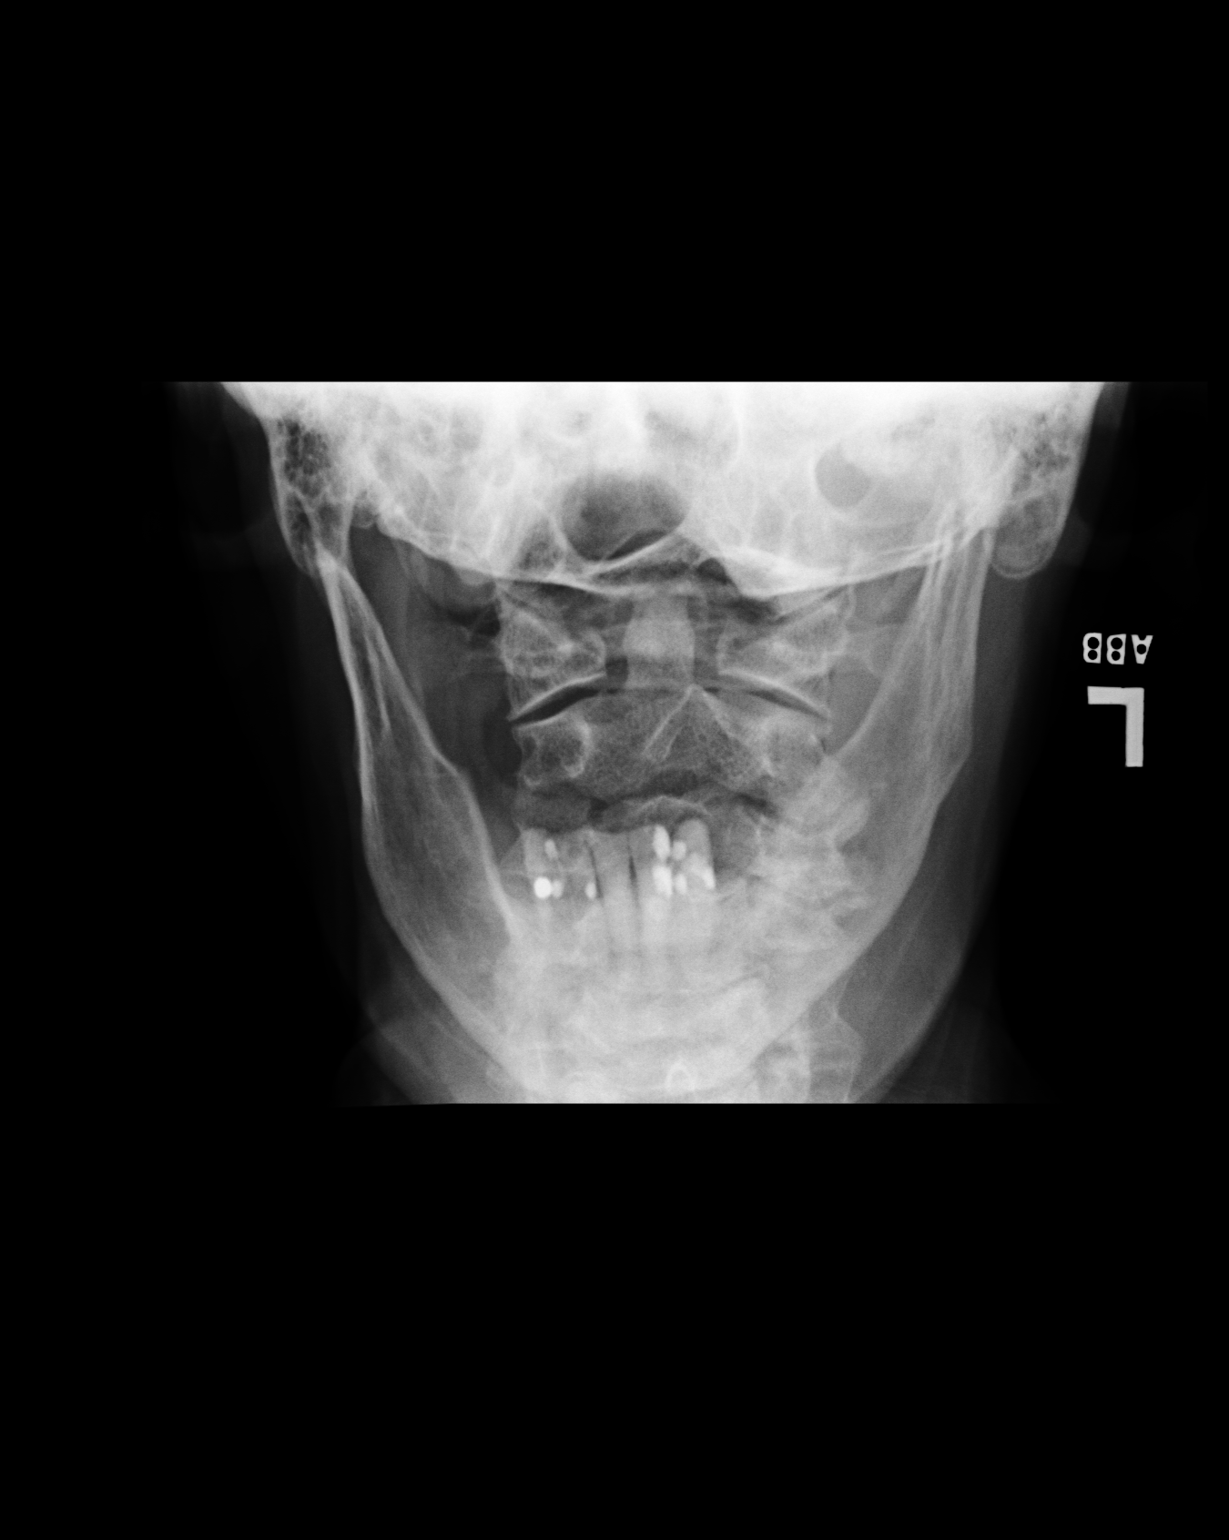

[3 of 3 positions shown; findings below may reference images not displayed]

FINDINGS: Multiple views of the patient’s cervical spine show on lateral projection all seven cervical vertebrae. There is reversal of curvature of the cervical spine noted. The examination shows from C4 through C7 a degenerative spondylosis with end plate sclerosis, hypertrophic bone formation and disc space narrowing at C4-5, C5-6 and C6-7. No fracture or subluxations observed. No prevertebral soft tissue swelling is seen. 

When compared with the earlier study dated 05/22/12, the degenerative changes have been progressive. The anterior posterior view shows C1 centered on C2.
IMPRESSION: Progressive degenerative cervical spondylosis C4 through C7. 

Persistent reversal of curvature of the cervical spine. 

No acute fracture or subluxation.

## 2017-12-03 IMAGING — CT CT SOFT TISSUE NECK WITHOUT AND WITH DYE
3 of 4 series · 16 of 33 positions shown, 19 images · IV contrast (CONTRAST)
Comparison: None available.

EXAM:  CT SOFT TISSUE NECK WITHOUT AND WITH DYE
INDICATION: Neck fullness.
TECHNIQUE: Axial CT imaging of the neck soft tissues was performed without and with 75 mL of Optiray 320. Exam was performed using 1 or more of the following dose reduction techniques: Automated exposure control, adjustment of the mA and/or kV according to patient size, or the use of iterative reconstruction technique.

[ax post · axial · 0.48mm/px · z∈[-341,-113]mm · 8 of 138 slices shown, 10 images]
[im 12/138  soft-tissue]
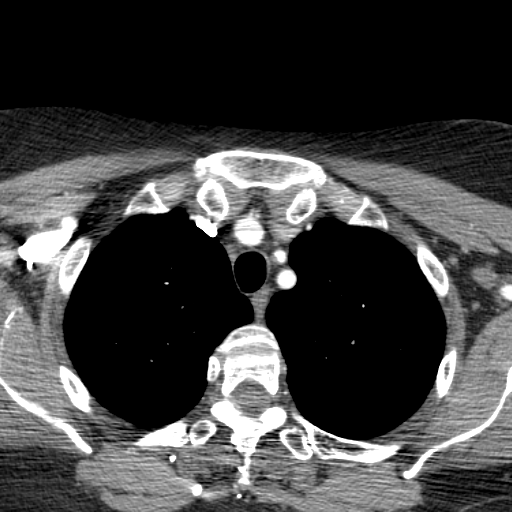
[im 12/138  bone]
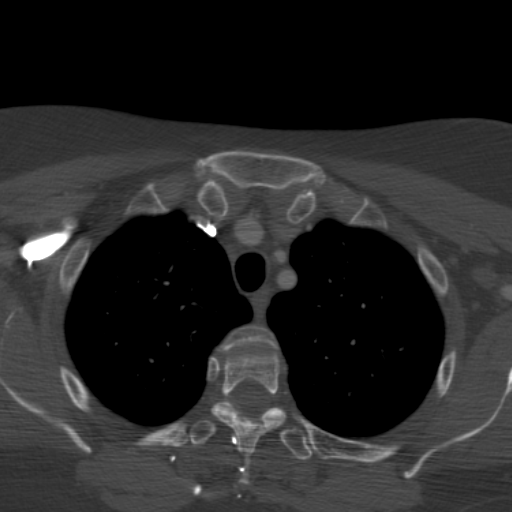
[im 35/138  bone]
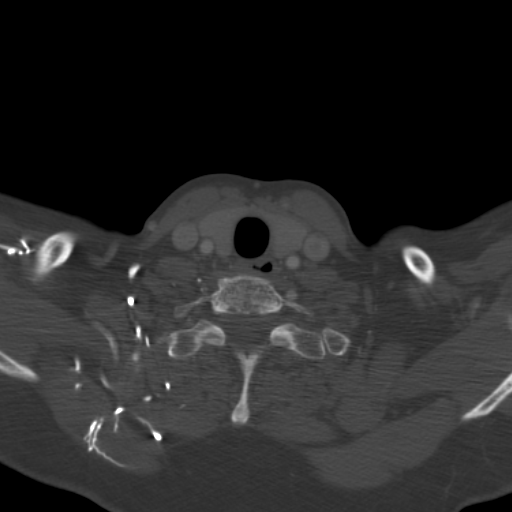
[im 46/138  bone]
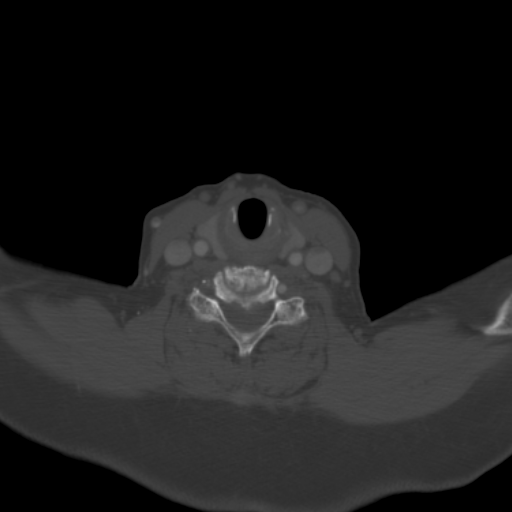
[im 58/138  bone]
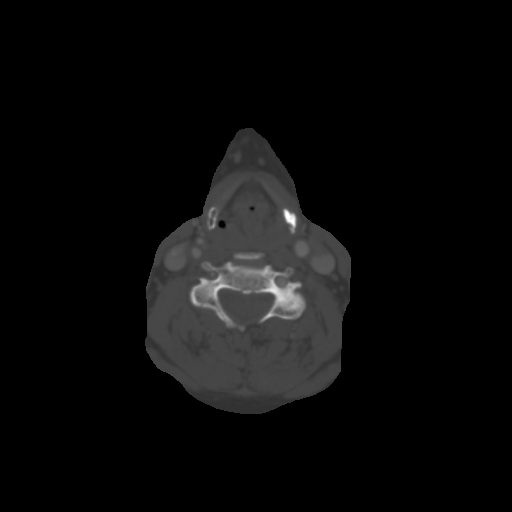
[im 80/138  soft-tissue]
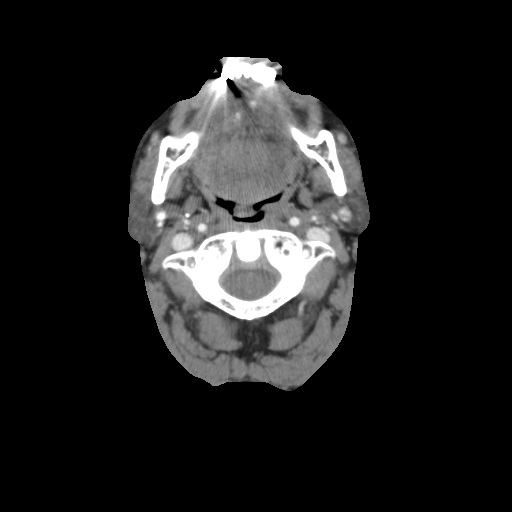
[im 80/138  bone]
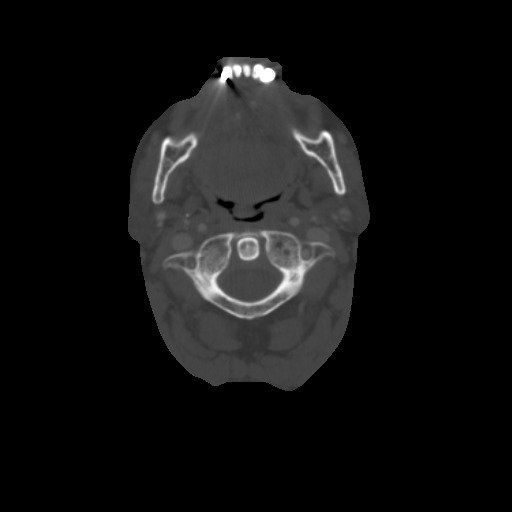
[im 92/138  bone]
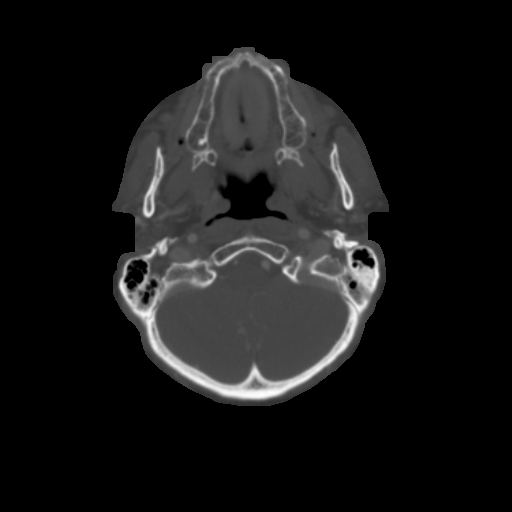
[im 103/138  bone]
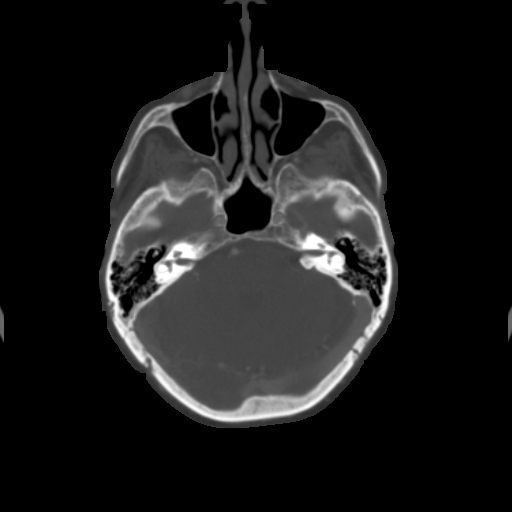
[im 126/138  bone]
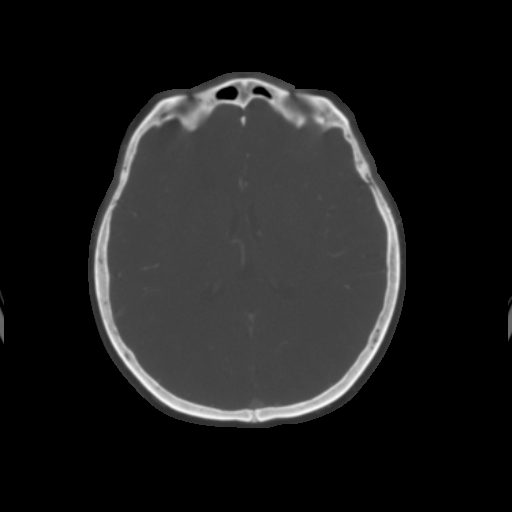

[cor · coronal · 0.50mm/px · 3 of 109 slices shown]
[im 22/109  bone]
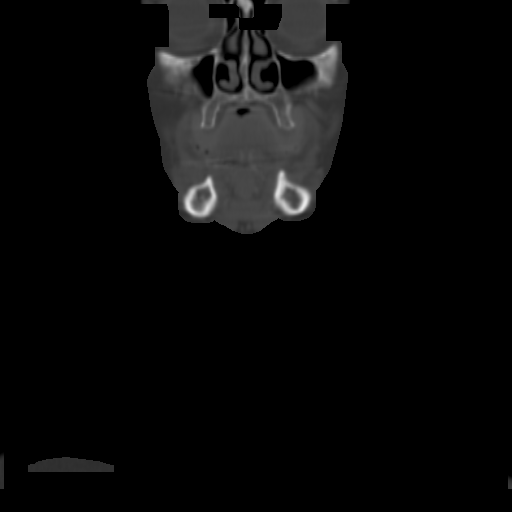
[im 44/109  bone]
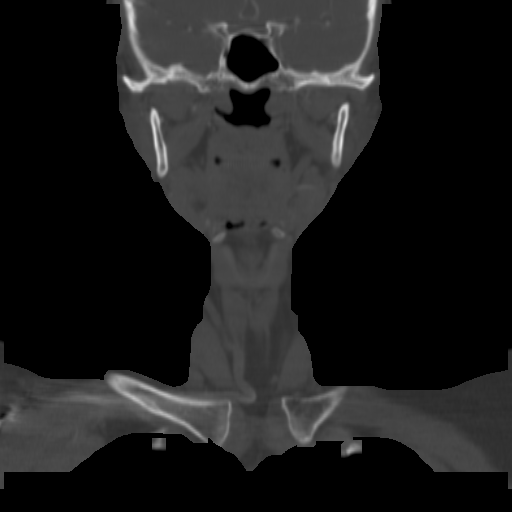
[im 65/109  bone]
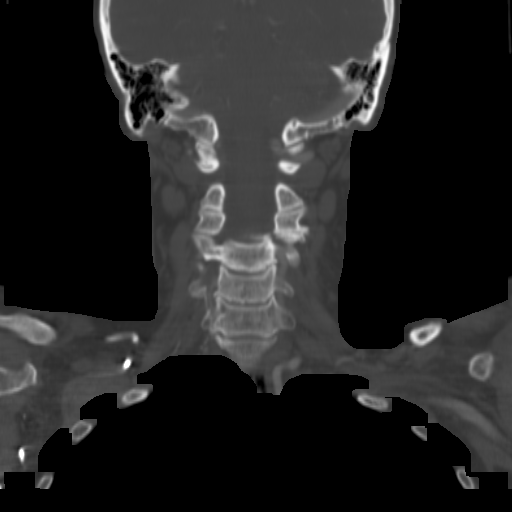

[sag · sagittal · 0.51mm/px · 5 of 93 slices shown, 6 images]
[im 31/93  bone]
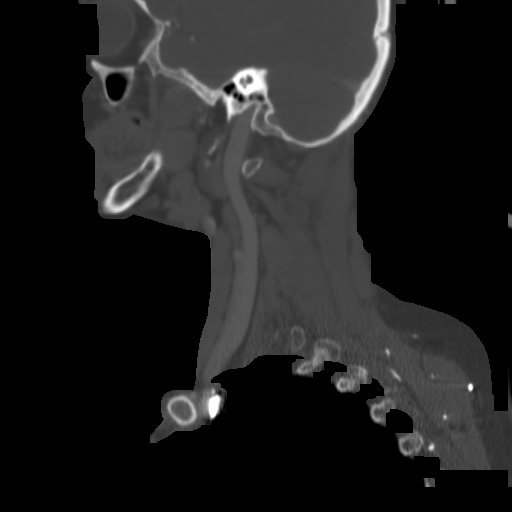
[im 39/93  bone]
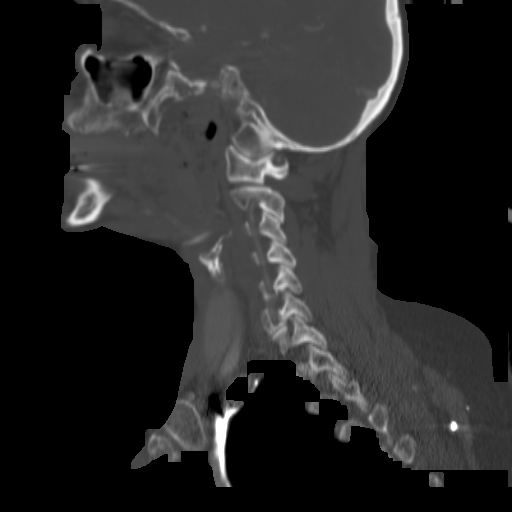
[im 47/93  soft-tissue]
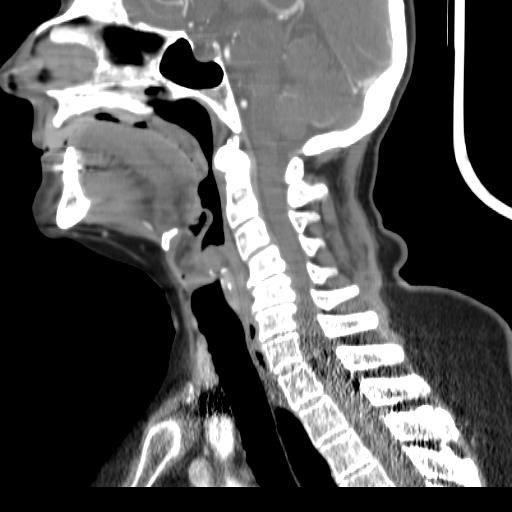
[im 47/93  bone]
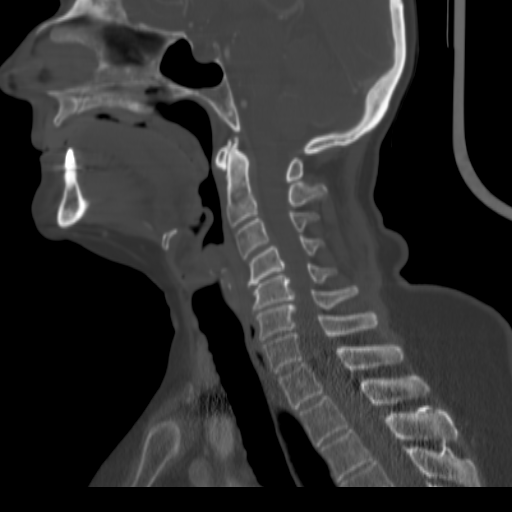
[im 54/93  bone]
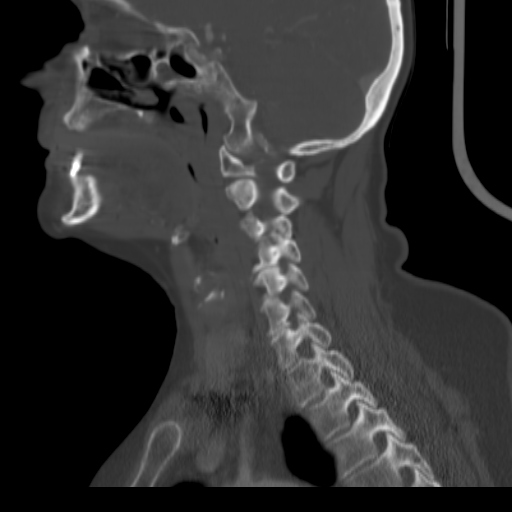
[im 62/93  bone]
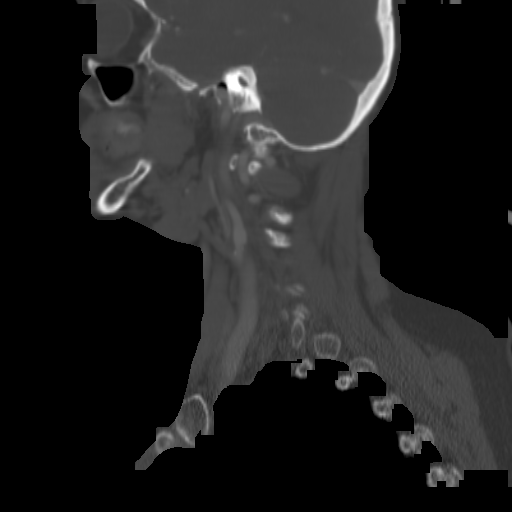

[16 of 33 positions shown; findings below may reference images not displayed]

FINDINGS: Margins of the oro- and nasopharynx are well-defined and fairly symmetric. There is no mass or suspicious soft tissue infiltration. Parotid, submandibular and thyroid glands are unremarkable. There is no cervical adenopathy. There is no abnormal enhancement. There are scattered vascular calcifications. Lung apices are clear. Advanced multilevel arthritic changes are seen within the cervical spine.
IMPRESSION: Unremarkable exam.

## 2018-09-25 IMAGING — CT CT THORAX WITHOUT CONTRAST
2 of 4 series · 15 of 36 positions shown, 18 images · non-contrast
Comparison: None available.

EXAM:  CT THORAX WITHOUT CONTRAST
INDICATION: COPD.
TECHNIQUE: Helical noncontrast CT imaging of the chest was performed. Images were reviewed in multiple windows and projections. Exam was performed using 1 or more of the following dose reduction techniques: Automated exposure control, adjustment of the mA and/or kV according to patient size, or the use of iterative reconstruction technique.

[ax · axial · 0.78mm/px · z∈[+209,+456]mm · 12 of 147 slices shown, 15 images]
[im 11/147  mediastinal]
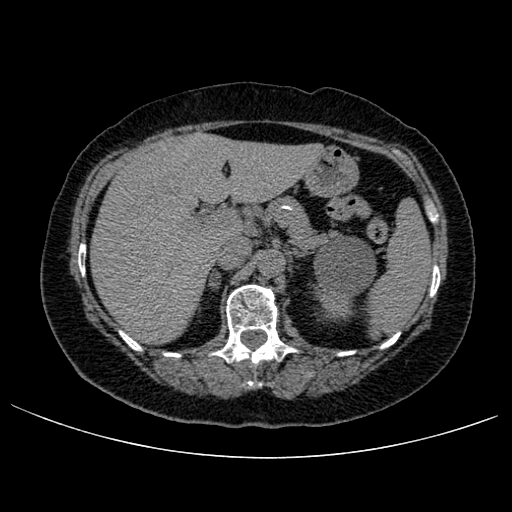
[im 11/147  lung]
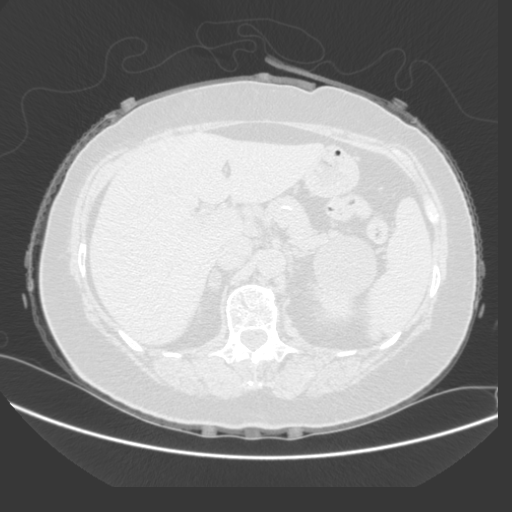
[im 21/147  lung]
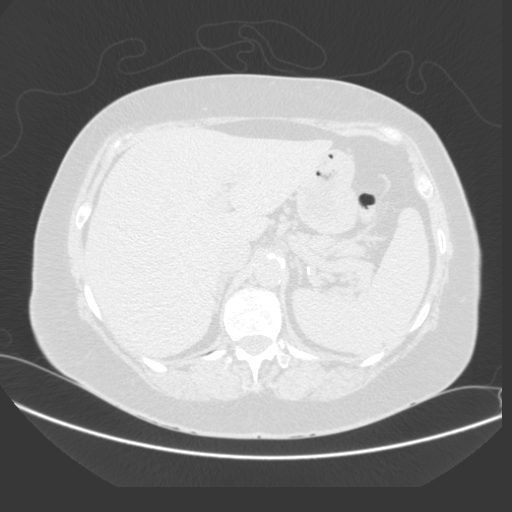
[im 32/147  lung]
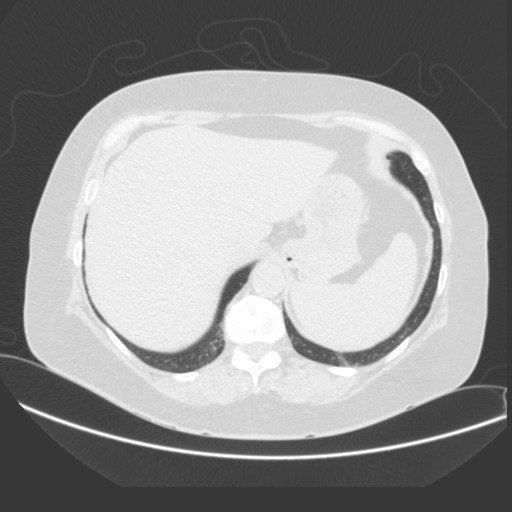
[im 42/147  lung]
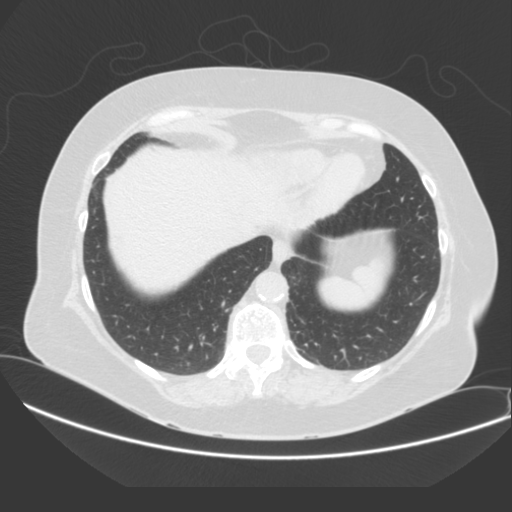
[im 53/147  mediastinal]
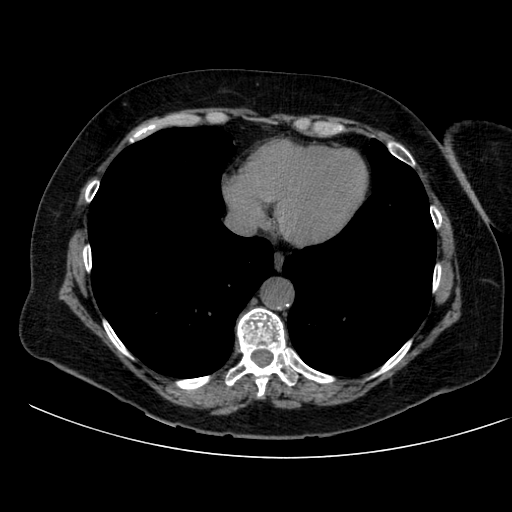
[im 53/147  lung]
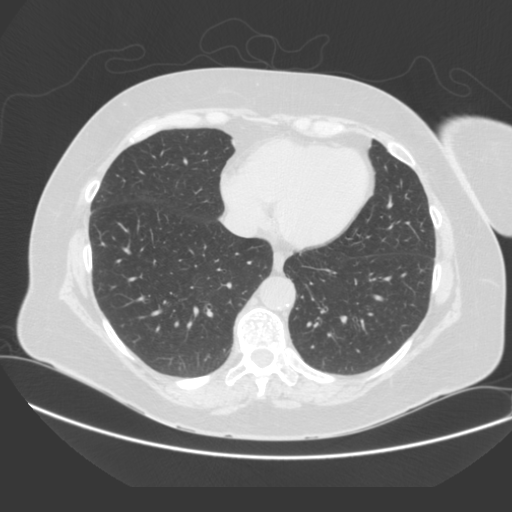
[im 63/147  lung]
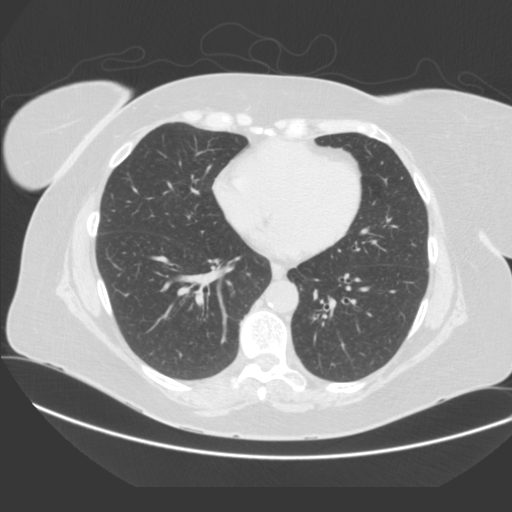
[im 84/147  lung]
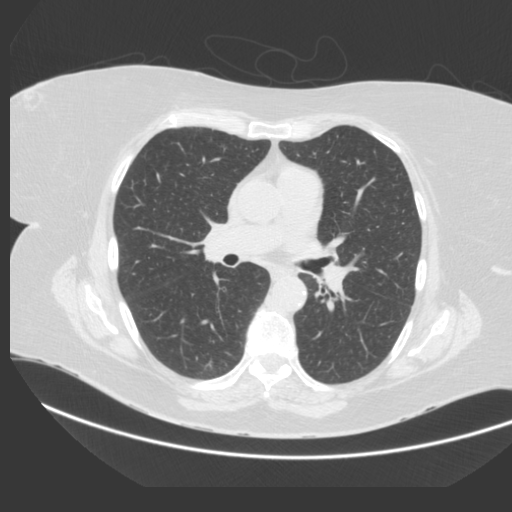
[im 94/147  lung]
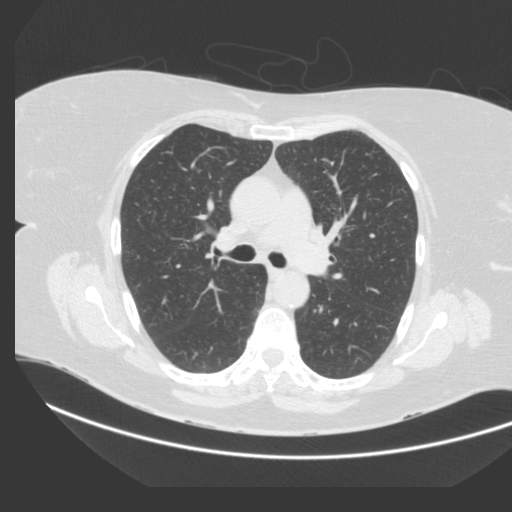
[im 105/147  mediastinal]
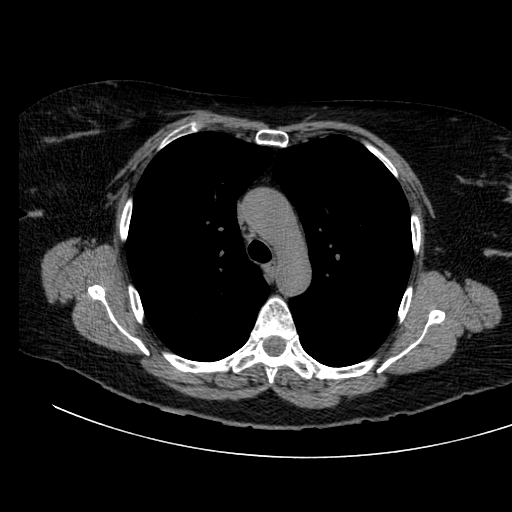
[im 105/147  lung]
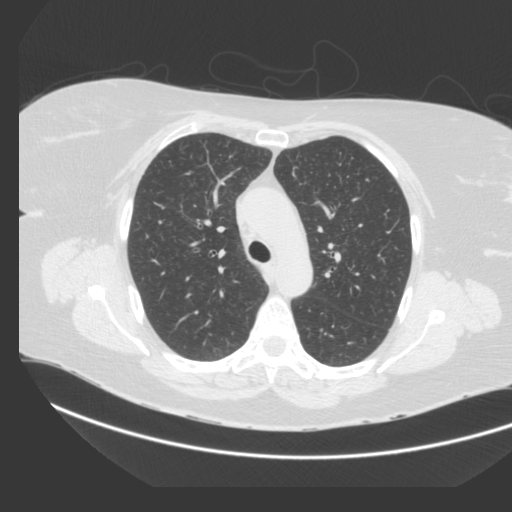
[im 115/147  lung]
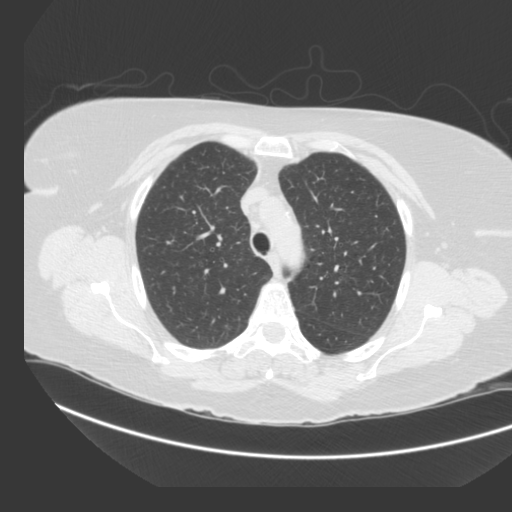
[im 126/147  lung]
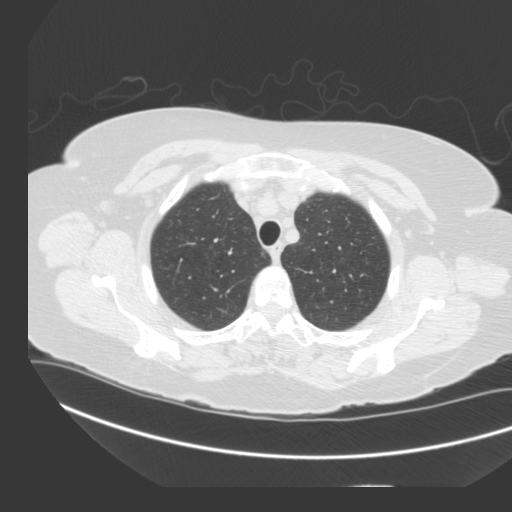
[im 136/147  lung]
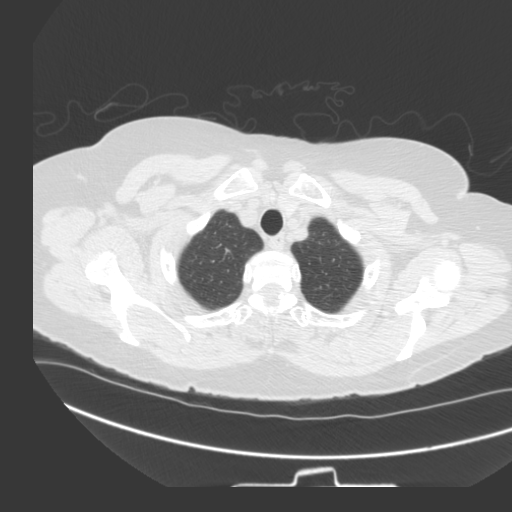

[cor · coronal · 0.77mm/px · 3 of 124 slices shown]
[im 25/124  lung]
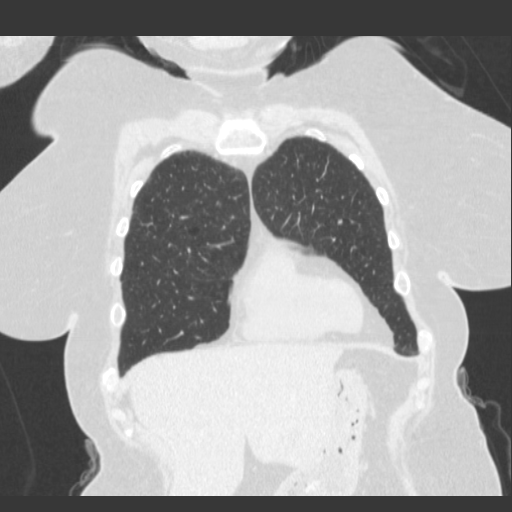
[im 50/124  lung]
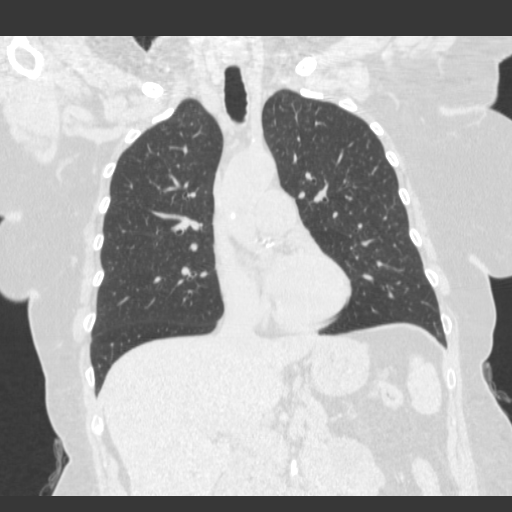
[im 74/124  lung]
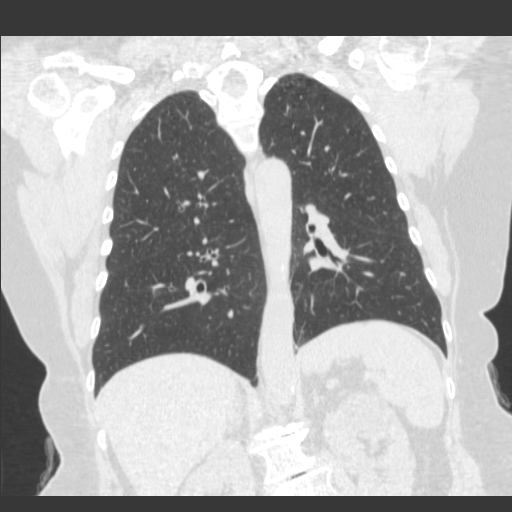

[15 of 36 positions shown; findings below may reference images not displayed]

FINDINGS: Visualized thyroid gland appears unremarkable. Trachea and mainstem bronchi are patent. There is no mediastinal or axillary adenopathy. Evaluation for hilar adenopathy is limited due to the lack of intravenous contrast. There are minimal emphysematous changes within the lungs. There is no suspicious lung nodule. There is no pleural or pericardial effusion. There is no lung consolidation. There is no aortic aneurysm. There are scattered vascular calcifications. There is a 5.3 cm left kidney upper pole cyst. Moderate multilevel arthritic changes seen within the midthoracic spine.
IMPRESSION: Minimal emphysematous changes within the lungs. 

No lung consolidation, pleural or pericardial effusion.

## 2018-09-25 IMAGING — CR XRAY THORACIC SPINE 2 VIEWS
1 series · 3 of 3 positions shown · non-contrast
Comparison: None available.

EXAM:  XRAY THORACIC SPINE 2 VIEWS
INDICATION: Mid back pain.

[Series 2: view not recorded · 0.17mm/px · 3 of 3 slices shown]
[im 1/3]
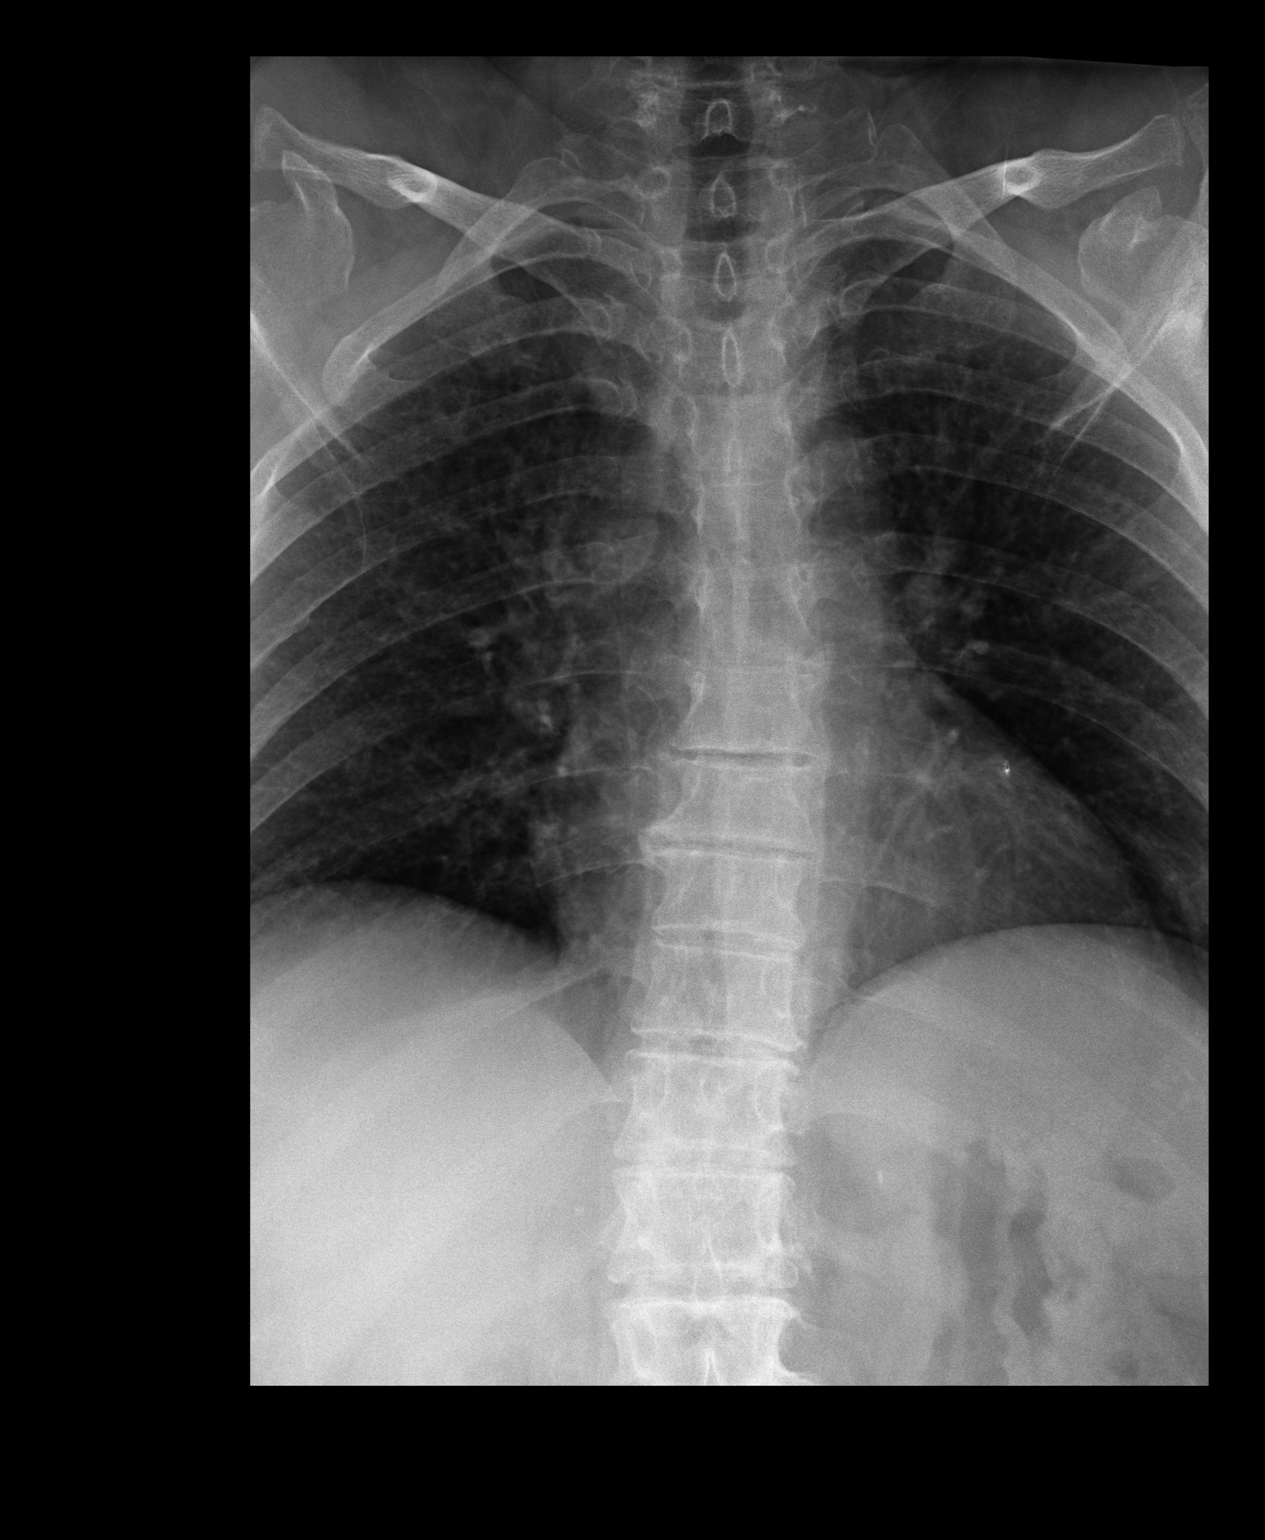
[im 2/3]
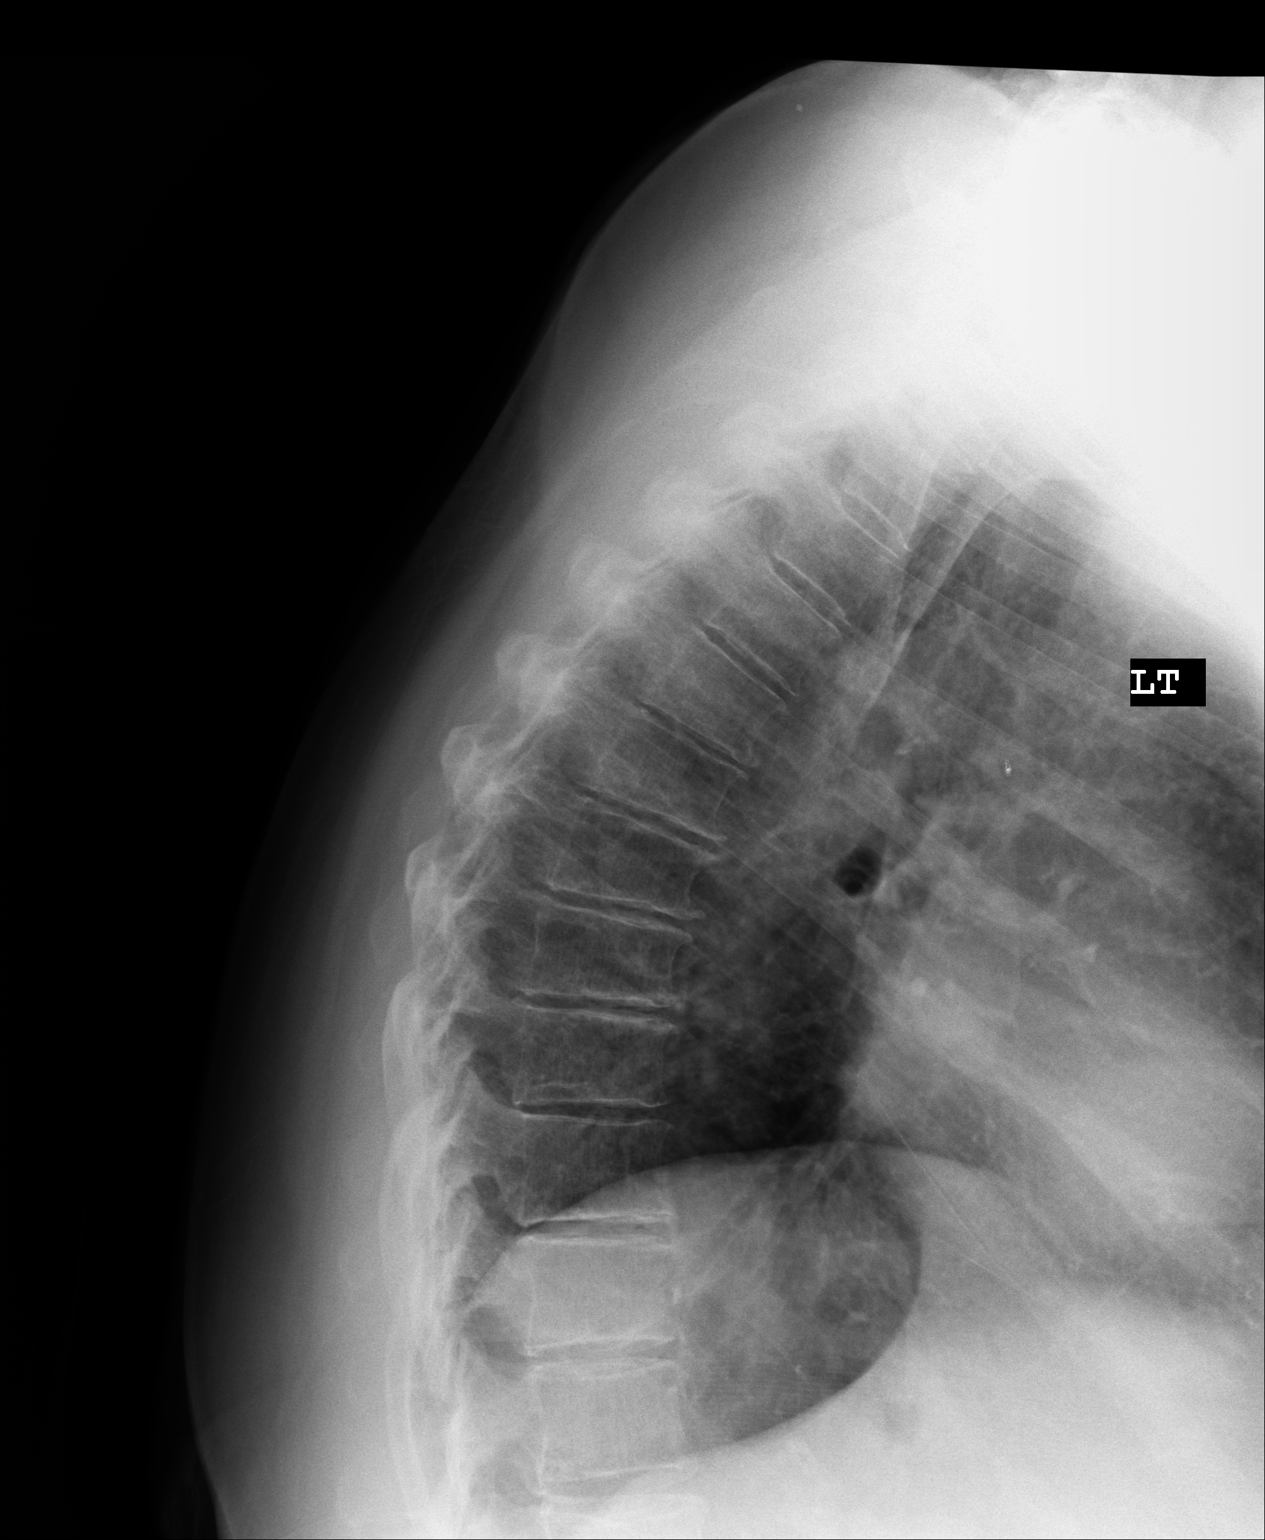
[im 3/3]
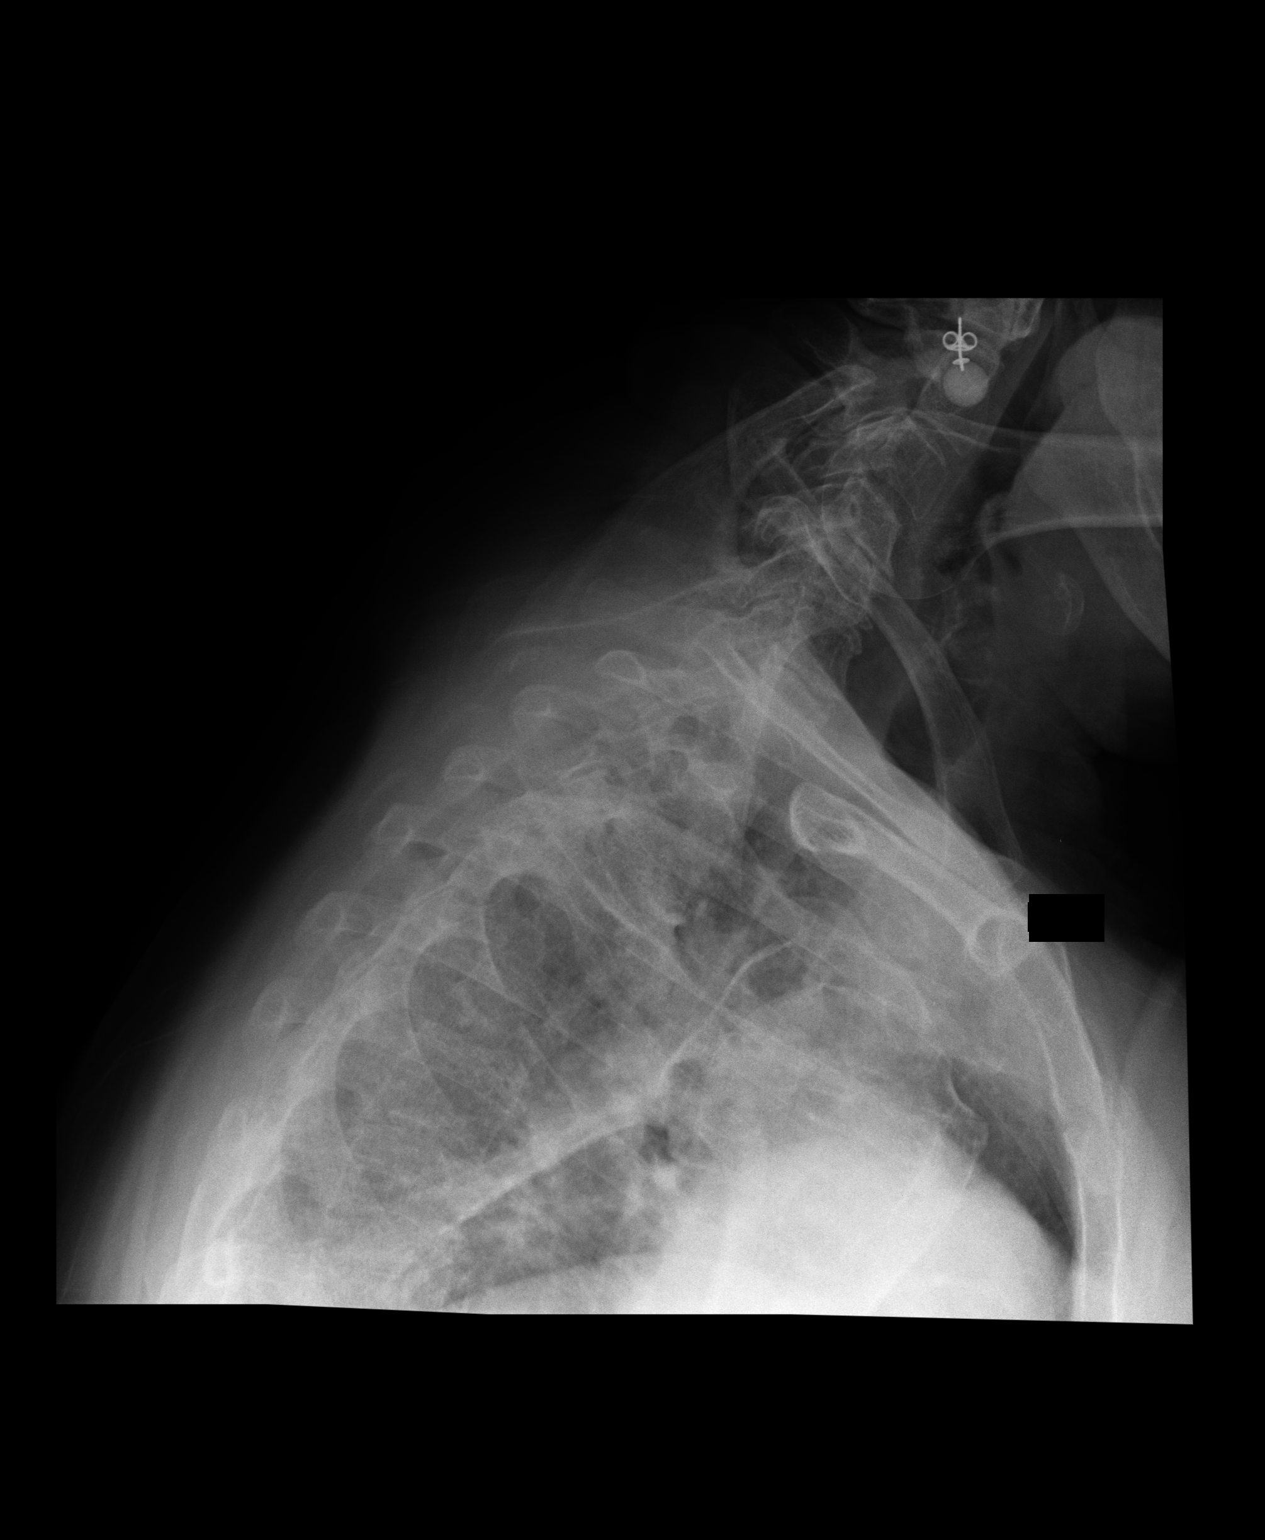

[3 of 3 positions shown; findings below may reference images not displayed]

FINDINGS: There is no acute fracture or subluxation. Mild degenerative disc disease is seen at multiple levels within the mid and lower thoracic spine. Paraspinal soft tissues are unremarkable.
IMPRESSION: Mild multilevel arthritic changes, no acute osseous abnormality.

## 2019-10-23 IMAGING — CR XRAY SHOULDER COMPLETE LT
1 series · 2 of 2 positions shown · non-contrast
Comparison: None available.

EXAM:  XRAY SHOULDER COMPLETE LT
INDICATION: Left shoulder pain.

[Series 3: view not recorded · 0.17mm/px · 2 of 2 slices shown]
[im 1/2]
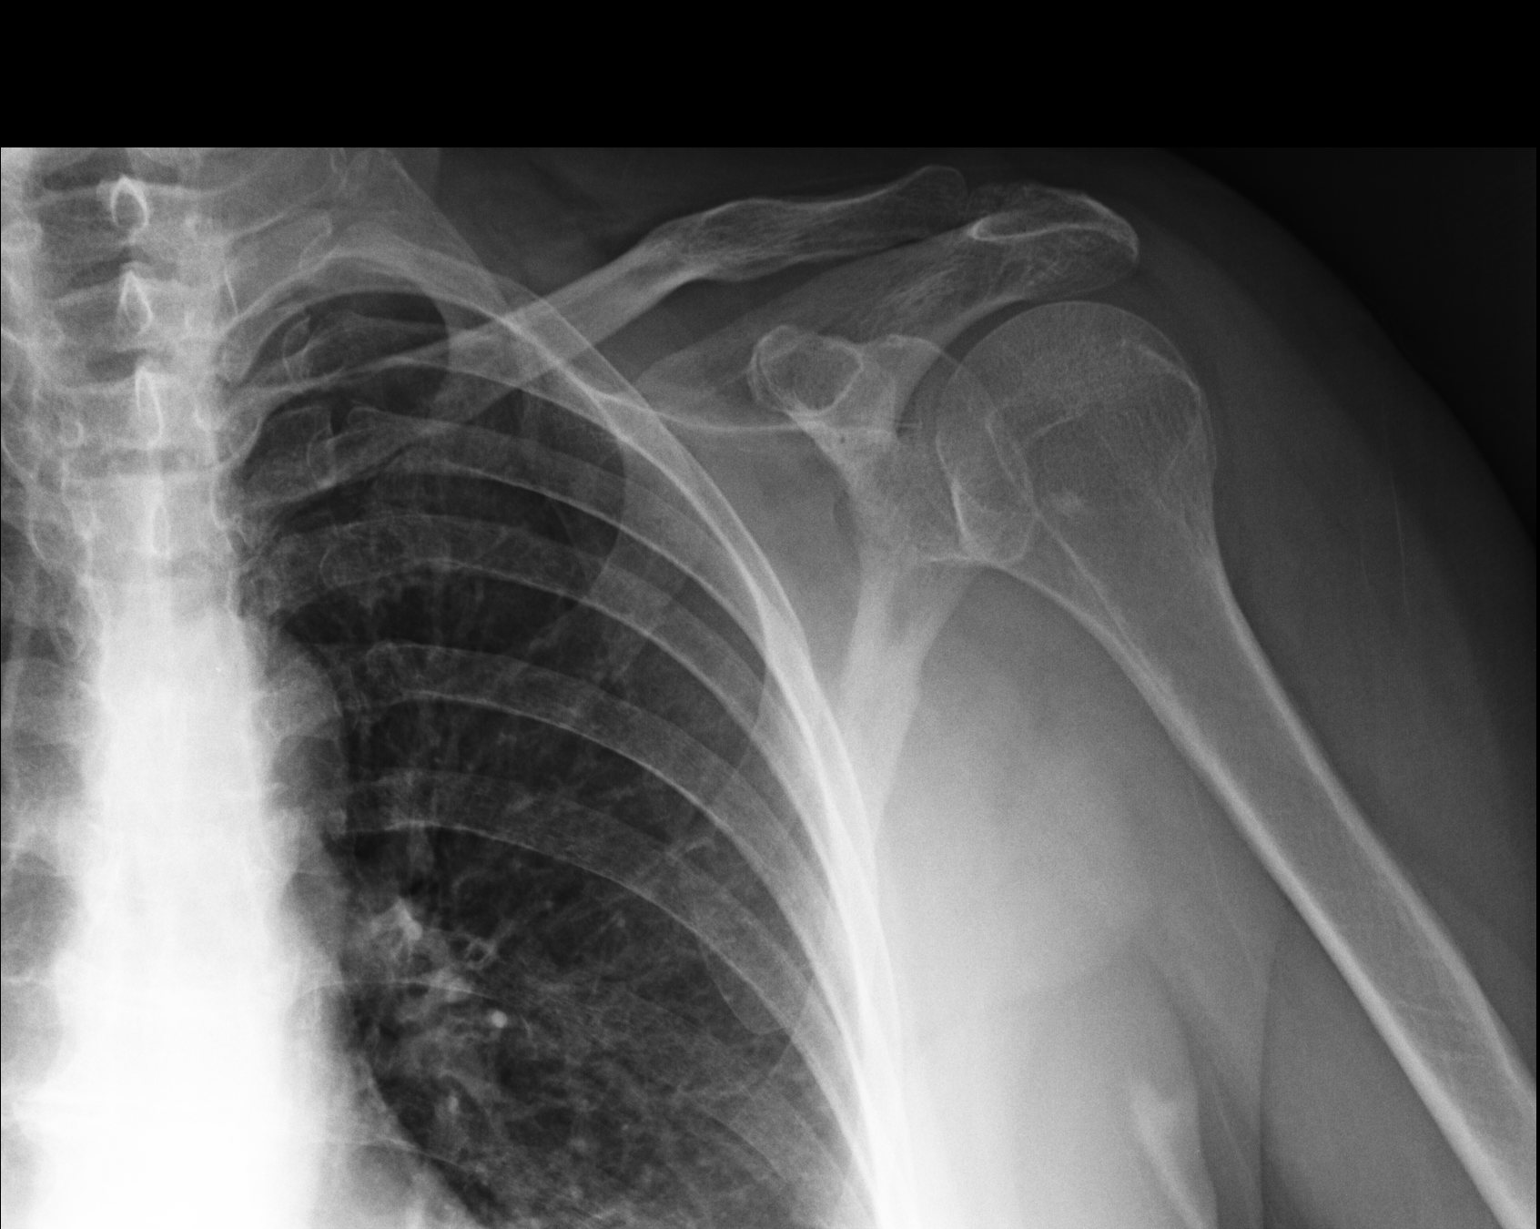
[im 2/2]
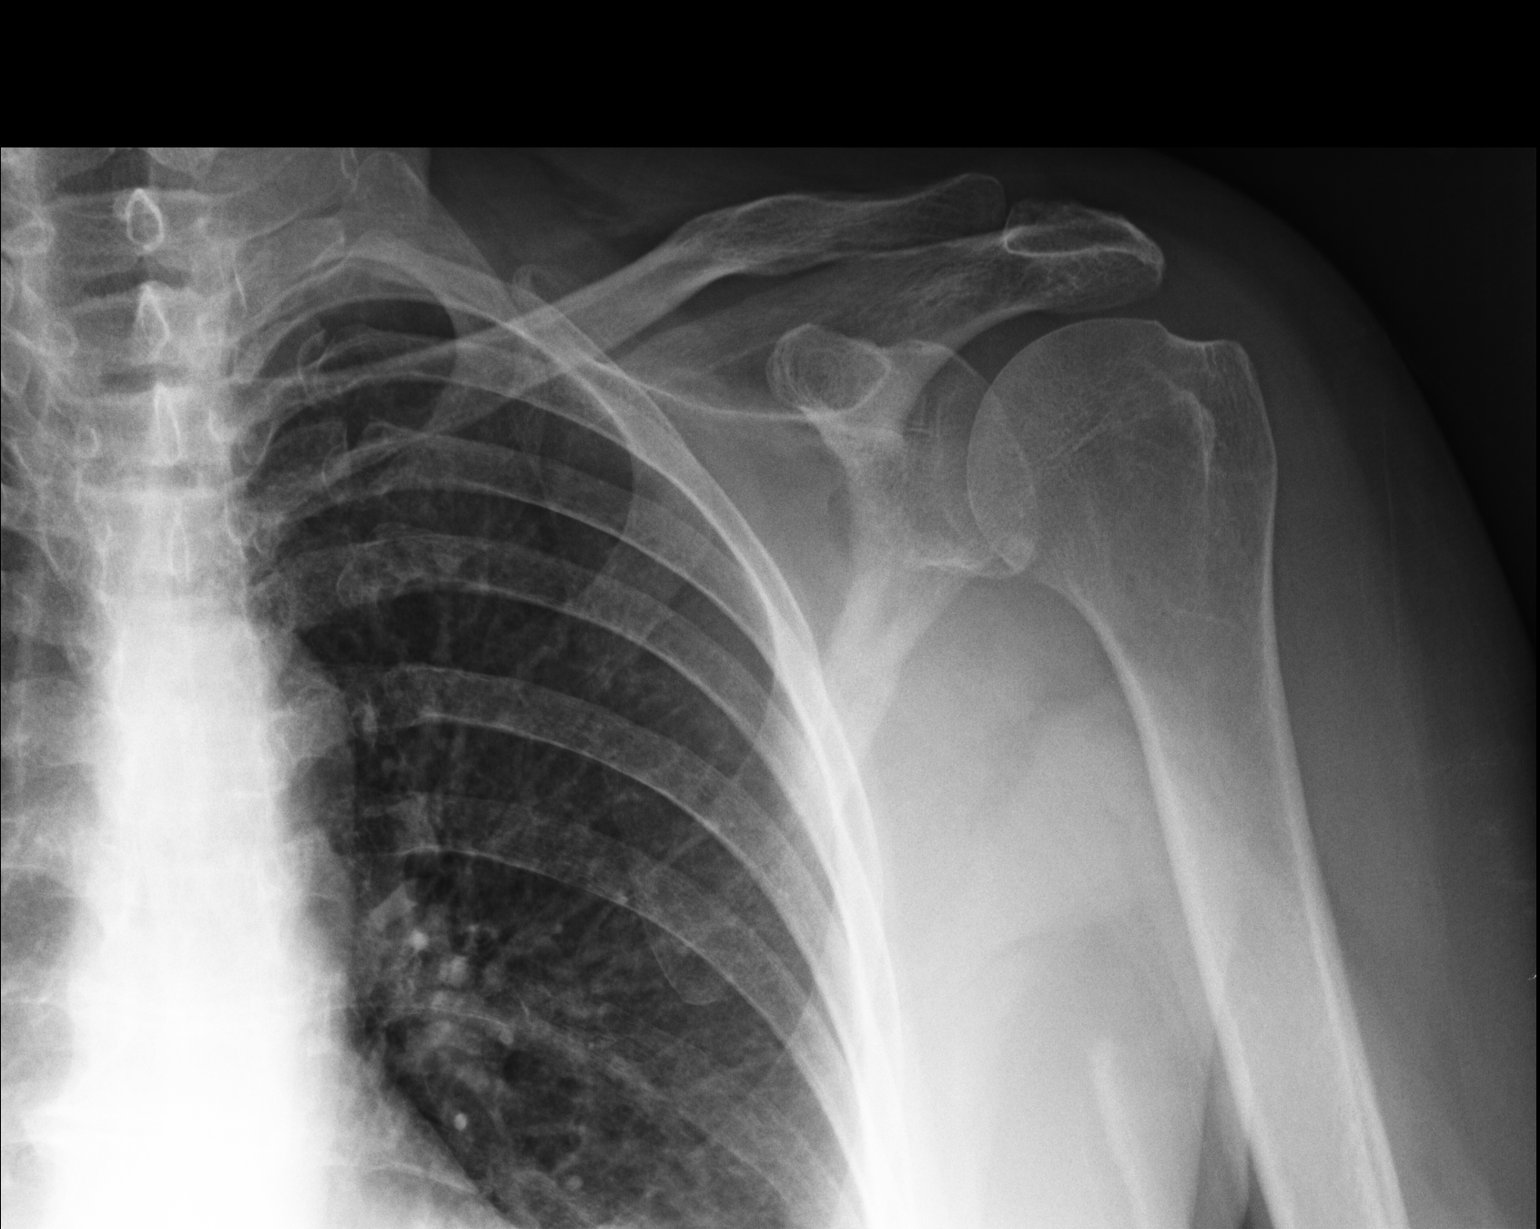

[2 of 2 positions shown; findings below may reference images not displayed]

FINDINGS: There is no acute fracture or subluxation. No significant arthritic changes are noted. Visualized left lung is clear. Surrounding soft tissues are unremarkable.
IMPRESSION: Unremarkable exam.

## 2019-11-07 IMAGING — MR MRI JOINT UPPER EXTREMITY WITHOUT CONTRAST LT
8 series · 35 of 40 positions shown · IV contrast (gadolinium)
Comparison: Radiographs dated 10/23/2019.

EXAM:  MRI JOINT UPPER EXTREMITY WITHOUT CONTRAST LT
INDICATION: Left shoulder pain and numbness.
TECHNIQUE: Multiplanar multisequential MRI of the left shoulder joint was performed without gadolinium contrast.

[Series 7: axial shim · axial · left · 10.0mm · 3.12mm/px · z∈[-90,-10]mm · 2 of 9 slices shown (1 of 2)]
[im 1/9]
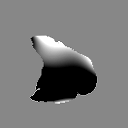
[im 9/9]
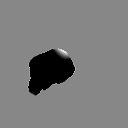

[Series 8: axial shim · axial · left · 10.0mm · 3.12mm/px · z∈[-90,-10]mm · 2 of 9 slices shown (2 of 2)]
[im 1/9]
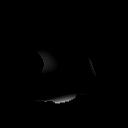
[im 9/9]
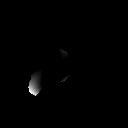

[Series 11: T1 · oblique · left · 4.0mm · 0.31mm/px · 6 of 22 slices shown]
[im 1/22]
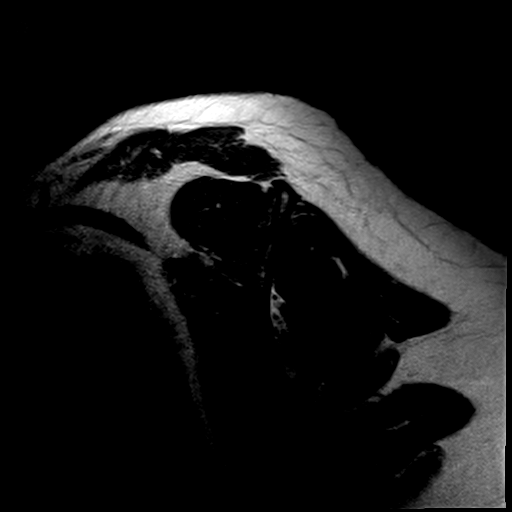
[im 5/22]
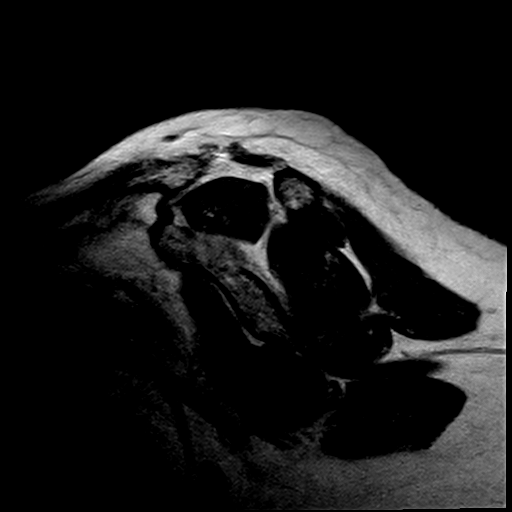
[im 9/22]
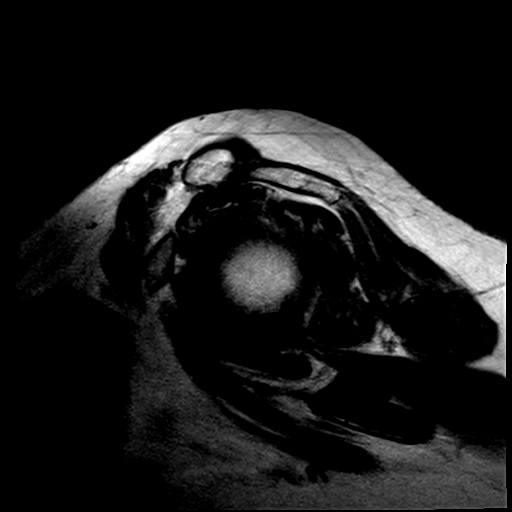
[im 13/22]
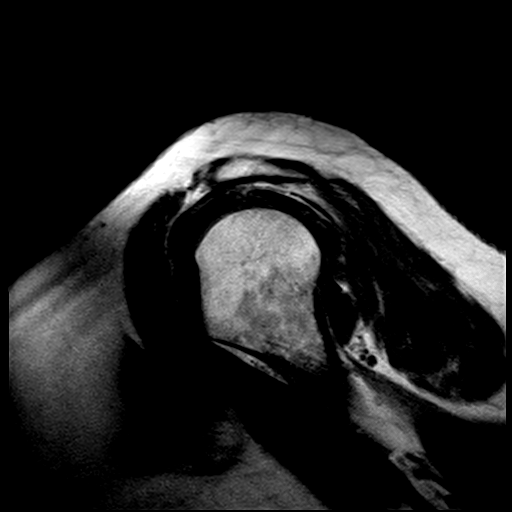
[im 17/22]
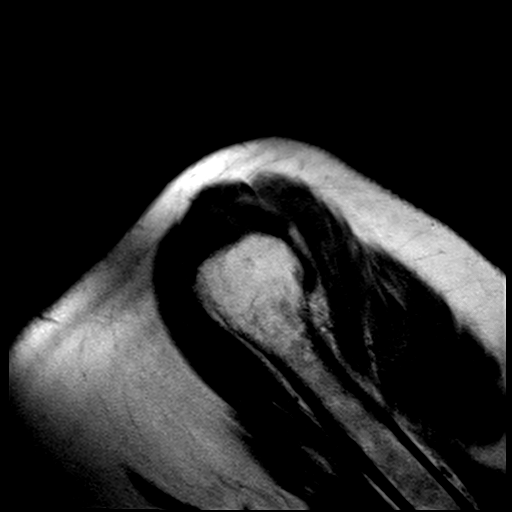
[im 22/22]
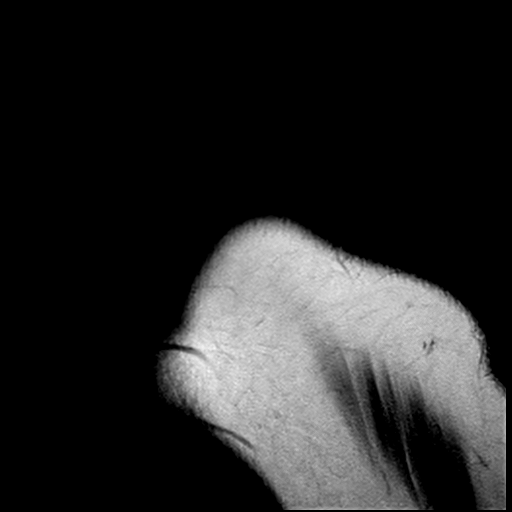

[Series 12: PD fat-sat · axial · left · 4.0mm · 0.36mm/px · z∈[-113,-12]mm · 6 of 24 slices shown (1 of 2)]
[im 1/24]
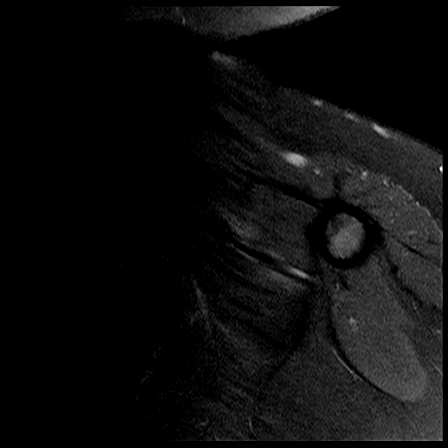
[im 5/24]
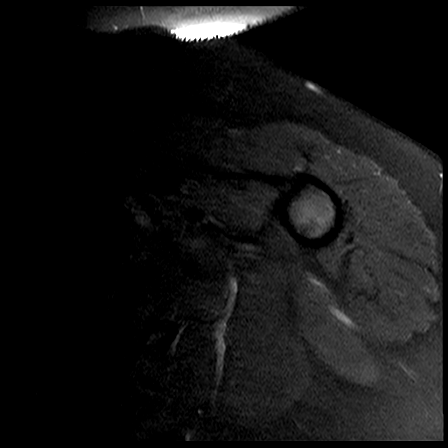
[im 10/24]
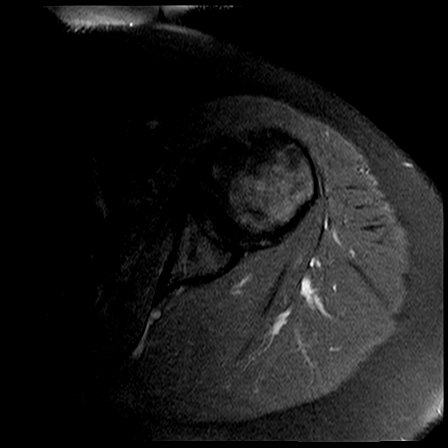
[im 14/24]
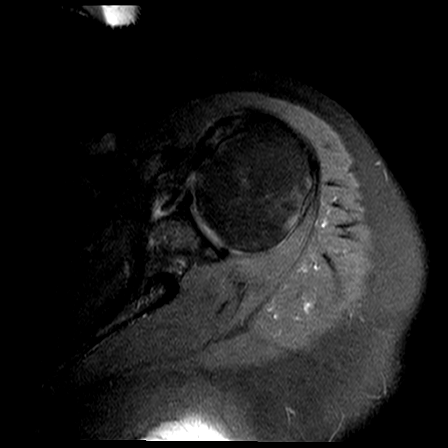
[im 19/24]
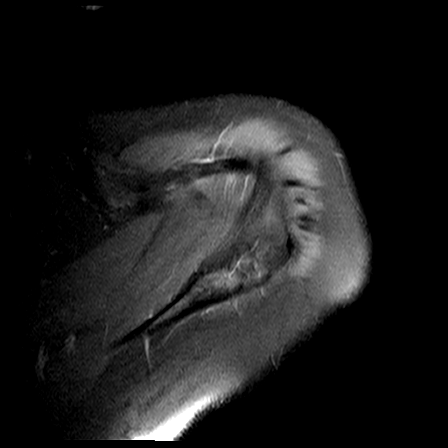
[im 24/24]
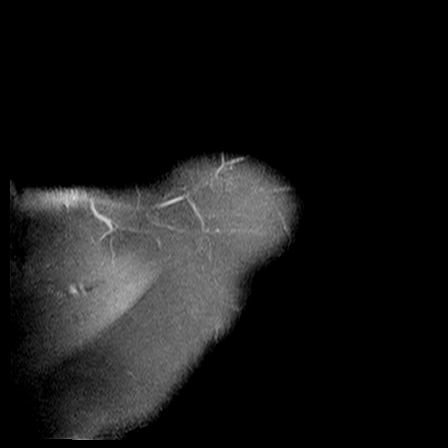

[Series 13: PD fat-sat · oblique · left · 3.5mm · 0.47mm/px · 6 of 23 slices shown (2 of 2)]
[im 1/23]
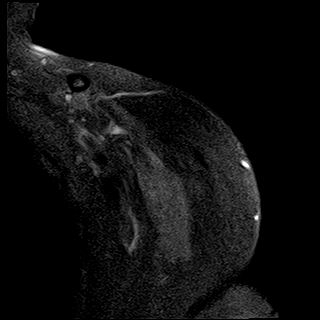
[im 5/23]
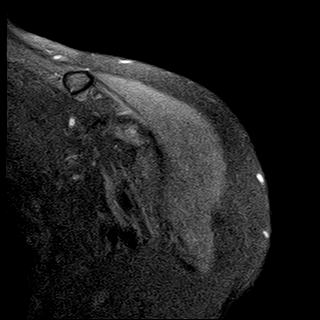
[im 9/23]
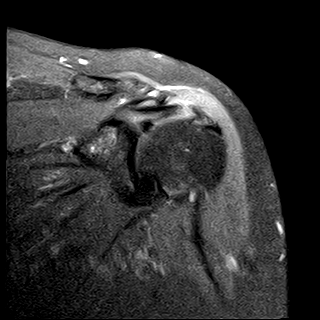
[im 14/23]
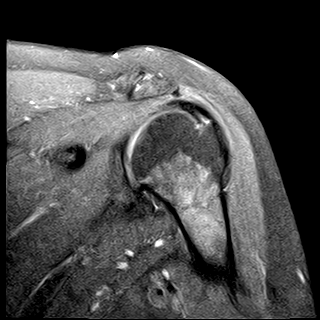
[im 18/23]
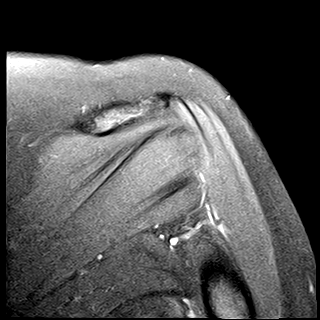
[im 23/23]
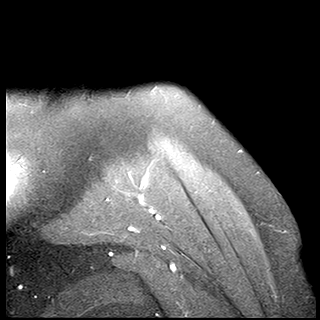

[Series 14: STIR · oblique · left · 3.5mm · 0.47mm/px · 6 of 23 slices shown (1 of 2)]
[im 1/23]
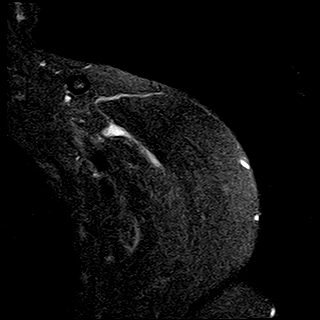
[im 5/23]
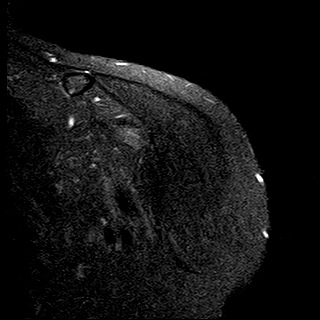
[im 9/23]
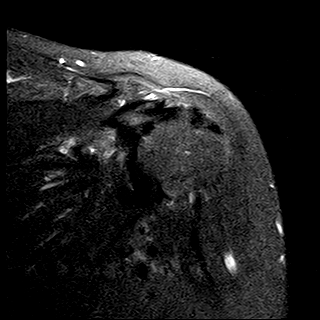
[im 14/23]
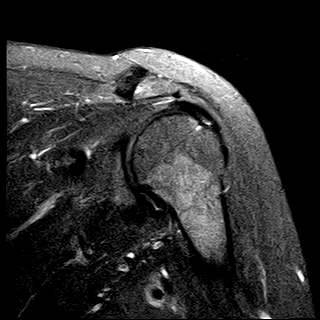
[im 18/23]
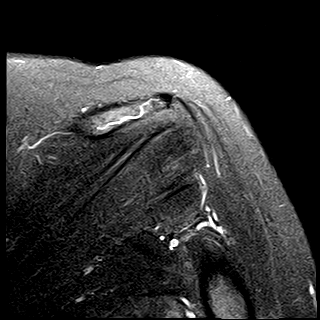
[im 23/23]
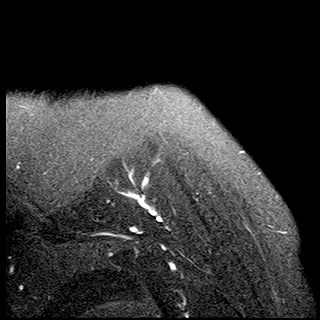

[Series 15: STIR · oblique · left · 4.0mm · 0.50mm/px · 1 of 22 slices shown (2 of 2)]
[im 1/22]
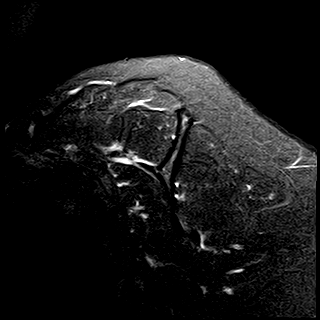

[Series 16: T2 fat-sat · axial · left · 4.0mm · 0.42mm/px · z∈[-113,-12]mm · 6 of 24 slices shown]
[im 1/24]
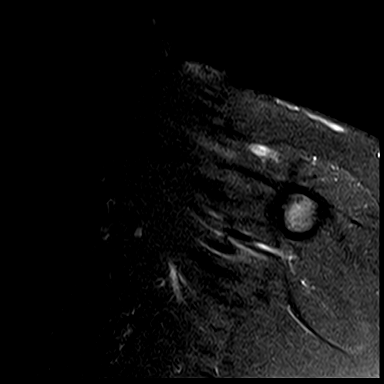
[im 5/24]
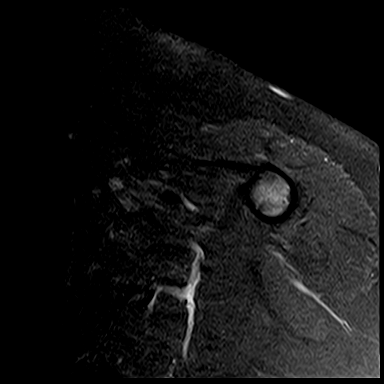
[im 10/24]
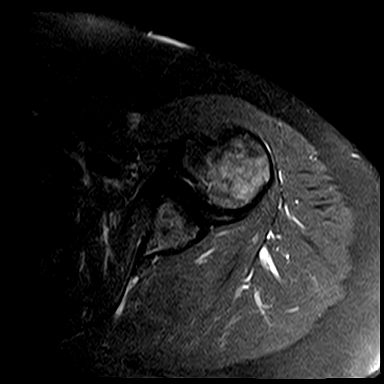
[im 14/24]
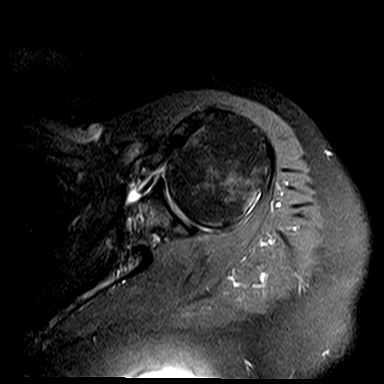
[im 19/24]
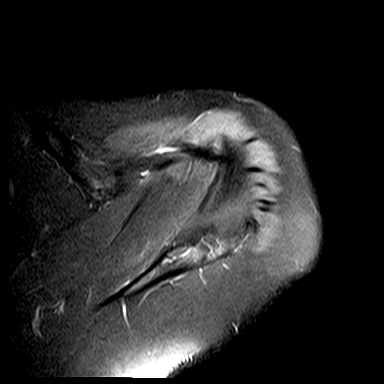
[im 24/24]
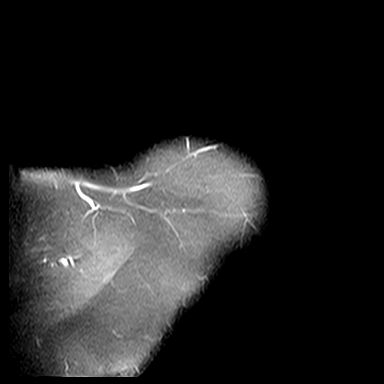

[35 of 40 positions shown; findings below may reference images not displayed]

FINDINGS: There is moderate supraspinatus tendinopathy. Infraspinatus, teres minor and subscapularis muscles and tendons are within normal limits in morphology and signal intensity. Long head of biceps tendon is well seated within the intertubercular groove and attaches normally to the biceps anchor. Superior labrum is intact. Glenohumeral articular cartilage is well maintained. There is moderate acromioclavicular joint osteoarthritis. There is no significant fluid within the subacromial/subdeltoid bursa. Muscle bulk and bone marrow signal intensity are normal. No mass is seen along the course of the suprascapular nerve, within the spinoglenoid notch or within the quadrilateral space.
IMPRESSION: Moderate supraspinatus tendinopathy. 

Mild acromioclavicular joint osteoarthritis.

## 2019-11-20 IMAGING — MR MRI CERVICAL SPINE WITHOUT CONTRAST
4 of 5 series · 23 of 48 positions shown · IV contrast (gadolinium)
Comparison: Radiographs dated 04/13/2017.

EXAM:  MRI CERVICAL SPINE WITHOUT CONTRAST
INDICATION: Neck pain with left upper extremity radiculopathy.
TECHNIQUE: Multiplanar multisequential MRI of the cervical spine was performed without gadolinium contrast.

[Series 10: T2 · sagittal · 3.0mm · 0.75mm/px · 8 of 13 slices shown (1 of 2)]
[im 1/13]
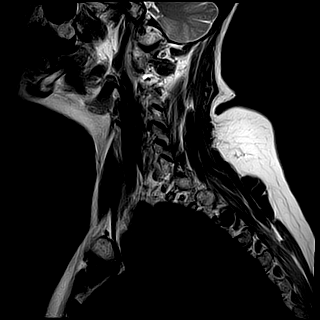
[im 2/13]
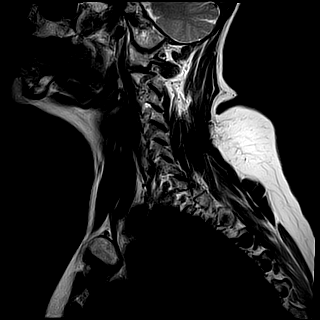
[im 4/13]
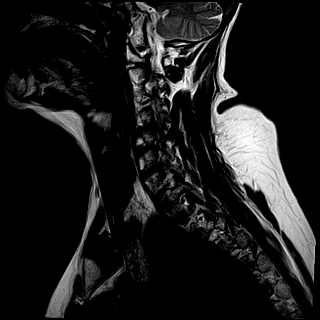
[im 6/13]
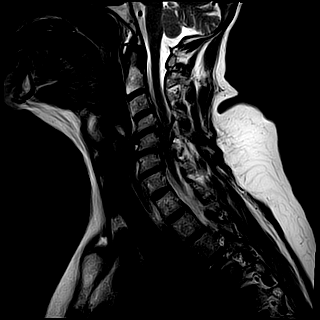
[im 7/13]
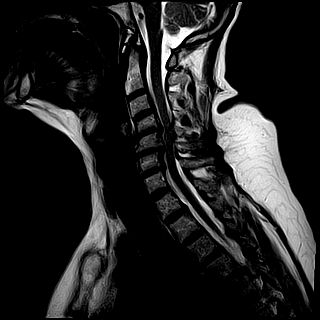
[im 9/13]
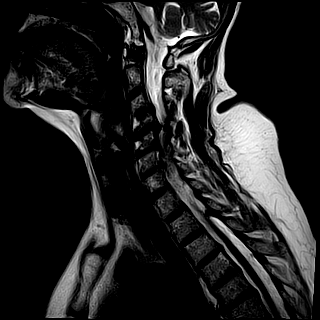
[im 11/13]
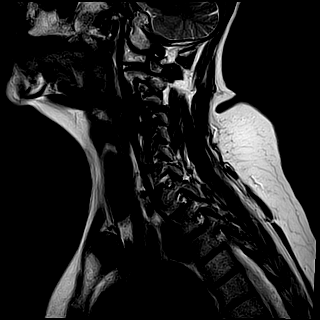
[im 13/13]
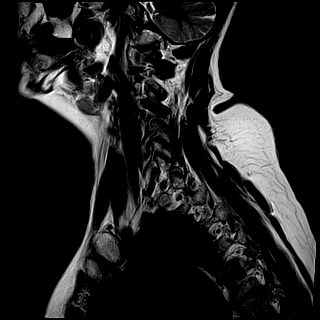

[Series 11: T1 · sagittal · 3.0mm · 0.47mm/px · 3 of 13 slices shown]
[im 2/13]
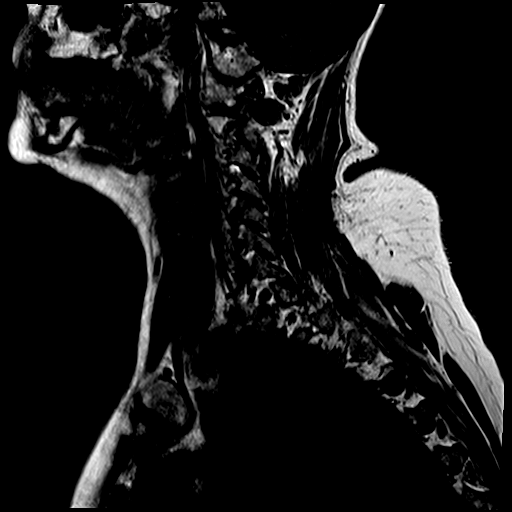
[im 7/13]
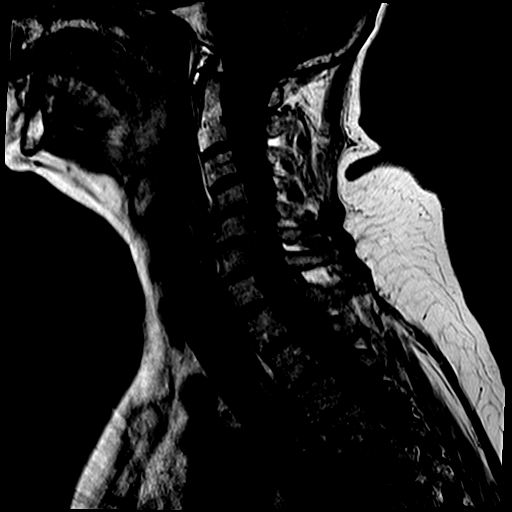
[im 11/13]
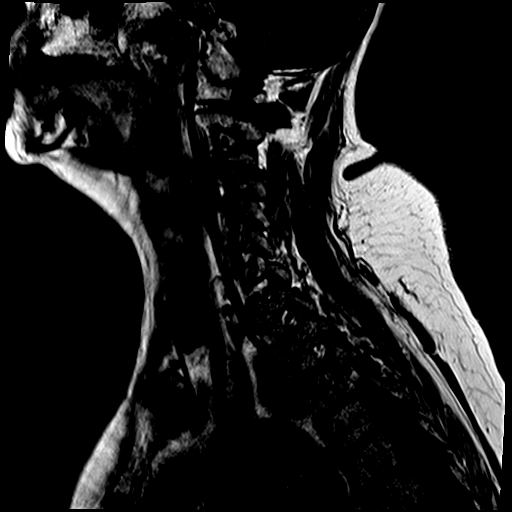

[Series 12: T2 · axial · 3.0mm · 0.31mm/px · z∈[-47,+44]mm · 9 of 18 slices shown (2 of 2)]
[im 1/18]
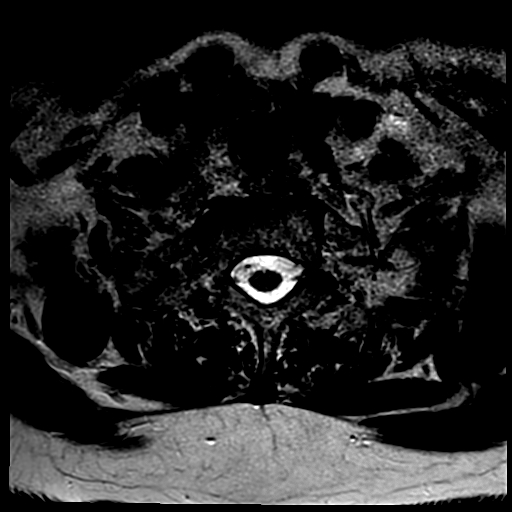
[im 4/18]
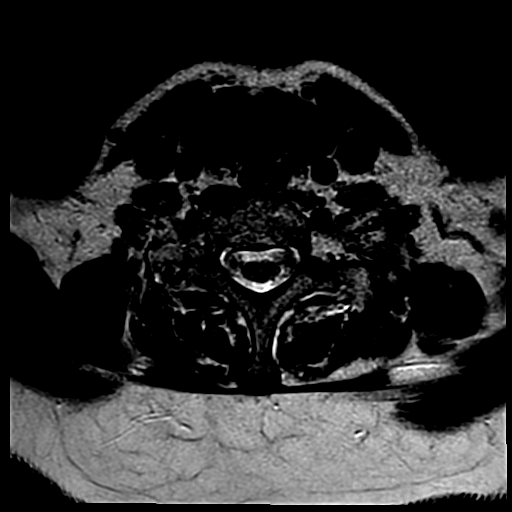
[im 5/18]
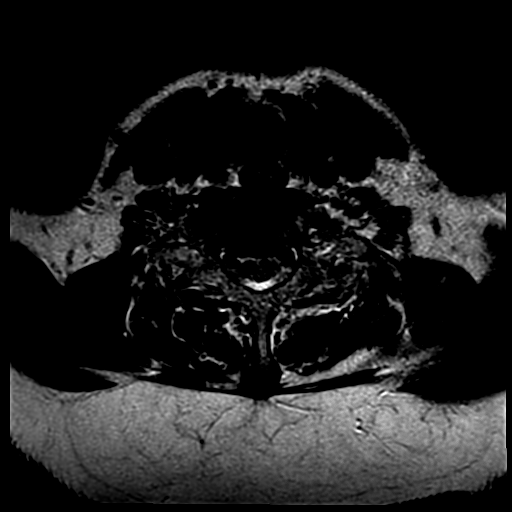
[im 8/18]
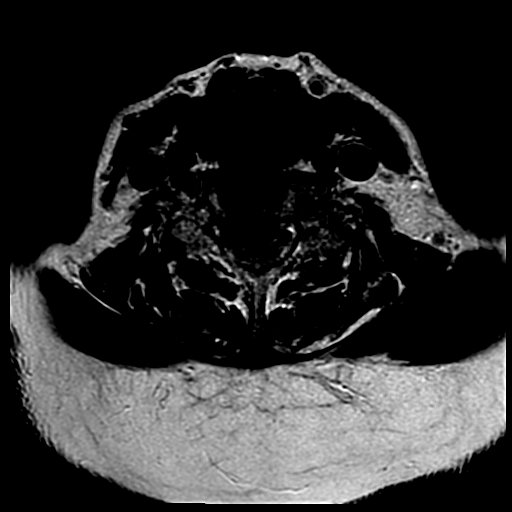
[im 10/18]
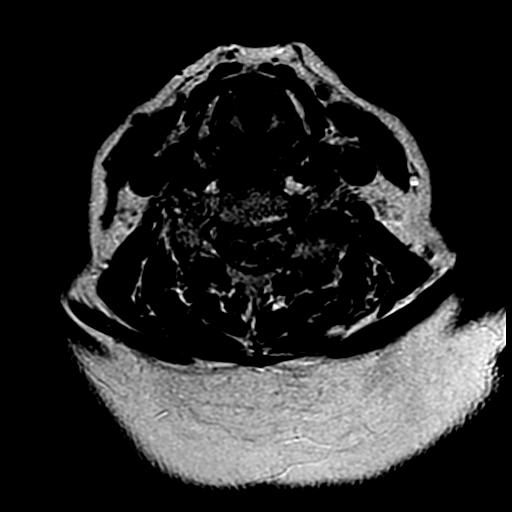
[im 13/18]
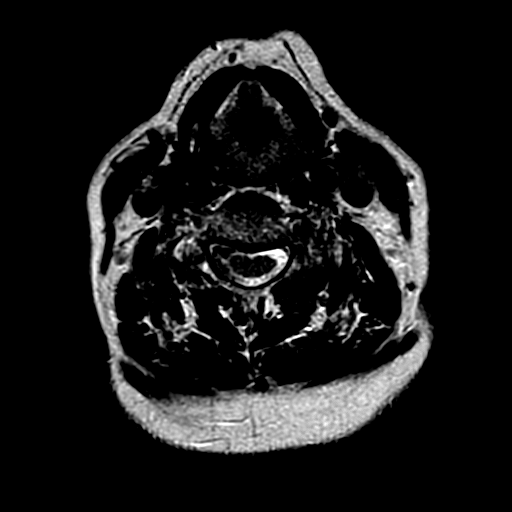
[im 14/18]
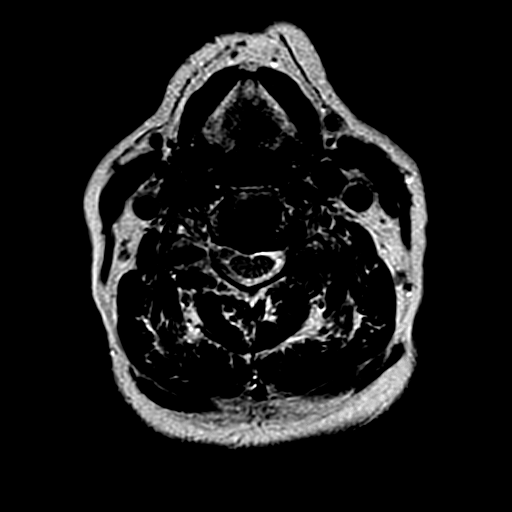
[im 16/18]
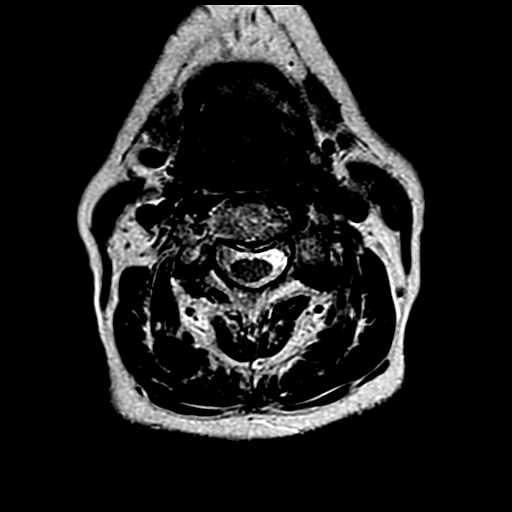
[im 18/18]
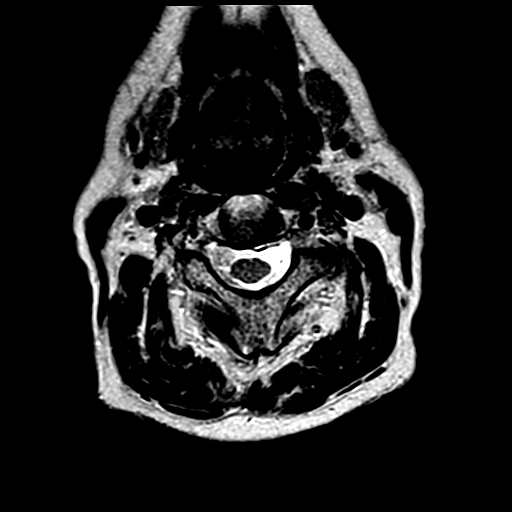

[Series 13: STIR · sagittal · 3.0mm · 0.47mm/px · 3 of 13 slices shown]
[im 2/13]
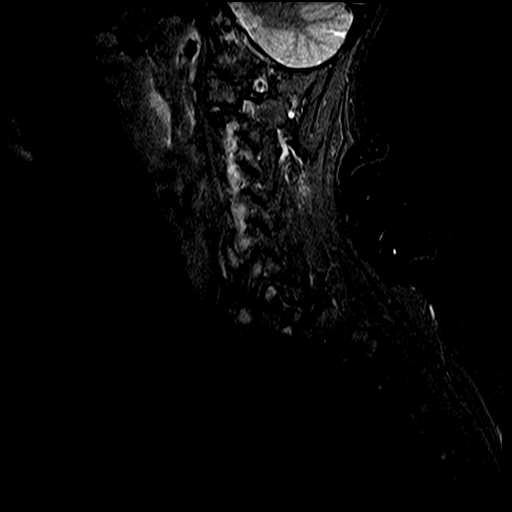
[im 7/13]
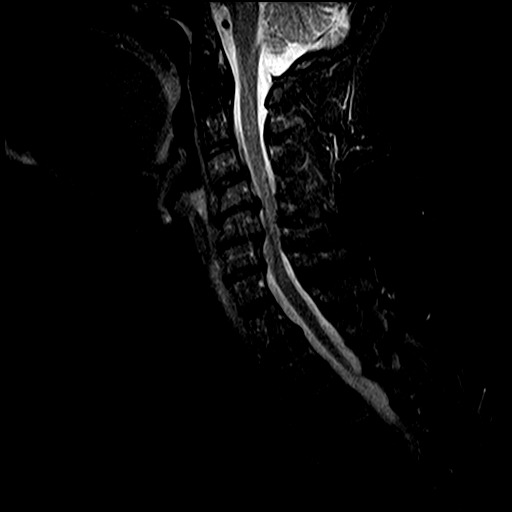
[im 11/13]
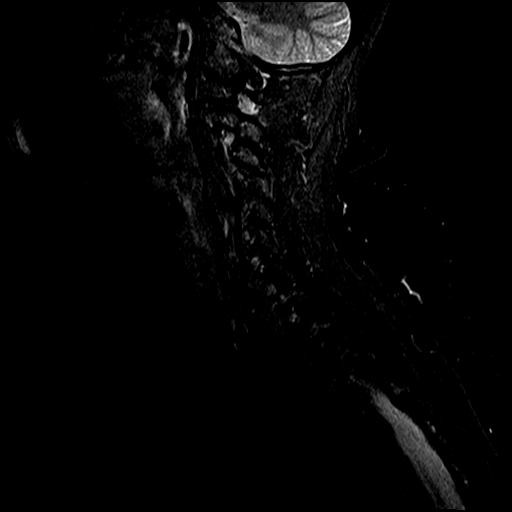

[23 of 48 positions shown; findings below may reference images not displayed]

FINDINGS: Mild reversal of cervical lordosis is likely positional. Bone marrow signal intensity is normal. There is no acute fracture or subluxation.

C2-3 level is unremarkable.

At C3-4 level, there is a minimal bulging annulus, minimally effacing the ventral CSF. There is moderate to severe left neural foraminal stenosis from facet and uncovertebral joint hypertrophy.

At C4-5 level, there is a small broad-based central disc osteophyte complex resulting in moderate to severe spinal stenosis. There is severe left and mild right neural foraminal stenosis from facet and uncovertebral joint hypertrophy.

At C5-6 level, there is minimal retrolisthesis of C5 on C6 vertebral body. There is a small broad-based central disc osteophyte complex resulting in moderate to severe spinal stenosis. There is severe right and mild-to-moderate left neural foraminal stenosis from facet and uncovertebral joint hypertrophy.

At C6-7 level, there is a small broad-based central disc osteophyte complex partially effacing the ventral CSF. There is severe bilateral neural foraminal stenosis from facet and uncovertebral joint hypertrophy.

C7-T1 level and paraspinal soft tissues are unremarkable.
IMPRESSION: Moderate to severe spinal stenosis at C4-5 and C5-6 levels from small central disc osteophyte complexes. No spinal cord signal abnormality is seen at this time to suggest cord edema or myelomalacia. Close clinical follow-up and neurosurgical consult are recommended. 

Multilevel neural foraminal stenosis as detailed above.

## 2020-01-08 DIAGNOSIS — M545 Low back pain, unspecified: Secondary | ICD-10-CM | POA: Insufficient documentation

## 2020-01-08 DIAGNOSIS — M5412 Radiculopathy, cervical region: Secondary | ICD-10-CM | POA: Insufficient documentation

## 2020-03-17 IMAGING — MR MRI THORACIC SPINE WITHOUT CONTRAST
4 of 5 series · 26 of 48 positions shown · IV contrast (gadolinium)
Comparison: Radiographs dated 09/25/2018.

EXAM:  MRI THORACIC SPINE WITHOUT CONTRAST
INDICATION: Mid back pain.
TECHNIQUE: Multiplanar multisequential MRI of the thoracic spine was performed without gadolinium contrast.

[Series 8: T2 · sagittal · 4.0mm · 0.78mm/px · 8 of 13 slices shown (1 of 2)]
[im 1/13]
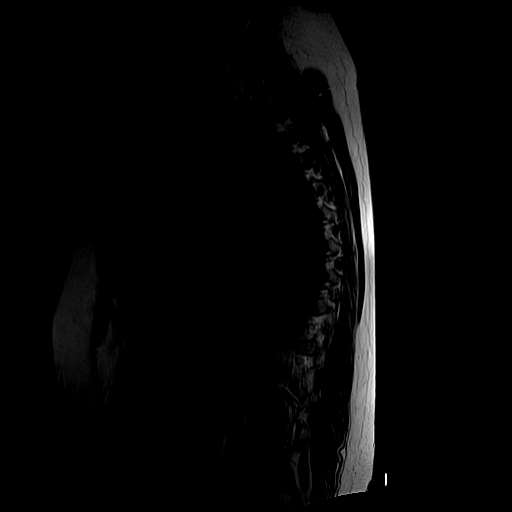
[im 2/13]
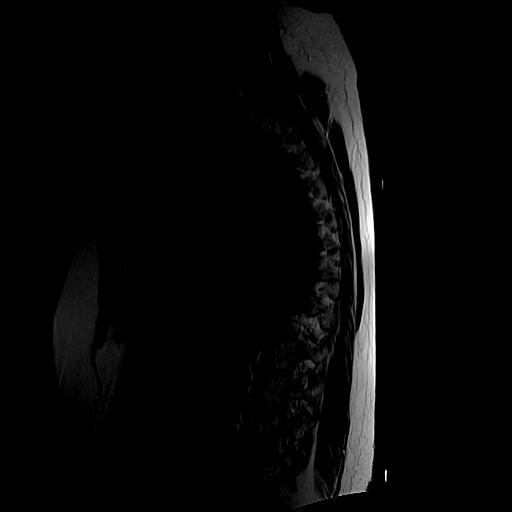
[im 5/13]
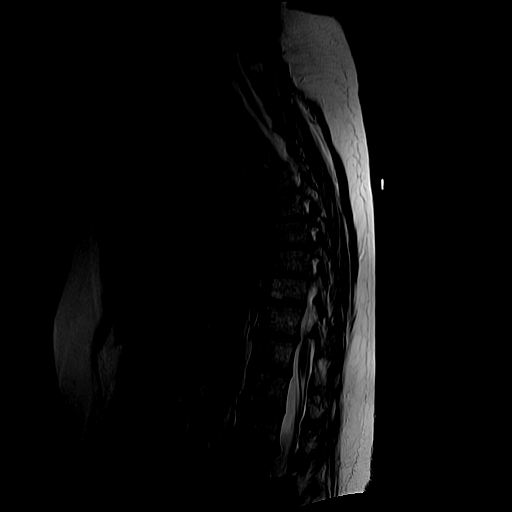
[im 6/13]
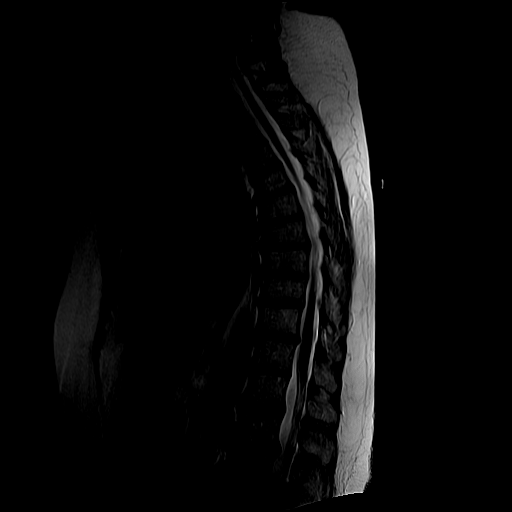
[im 7/13]
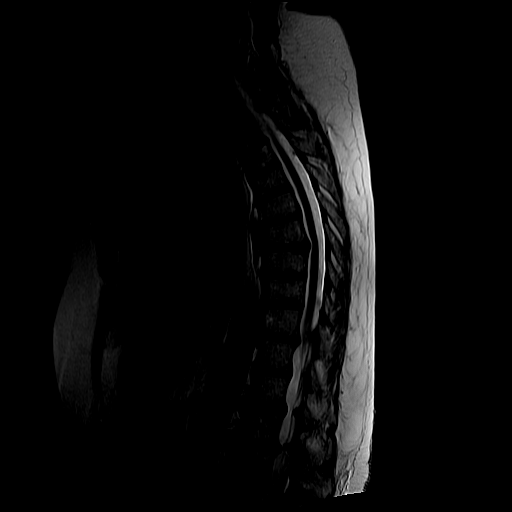
[im 9/13]
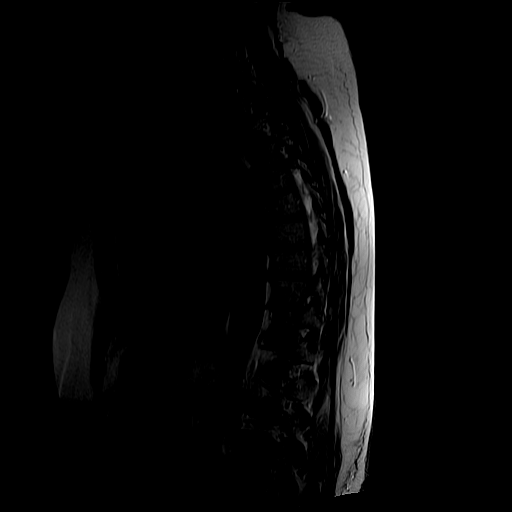
[im 11/13]
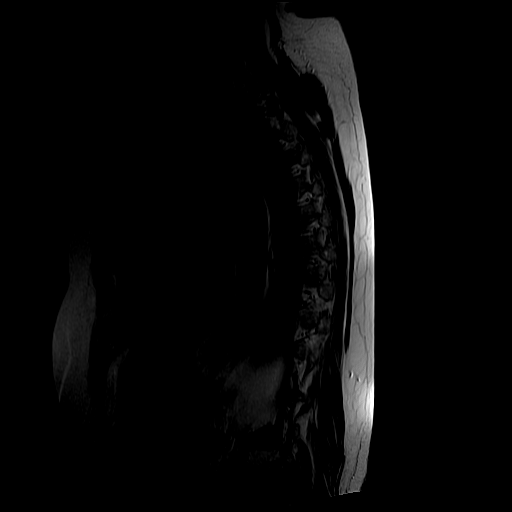
[im 13/13]
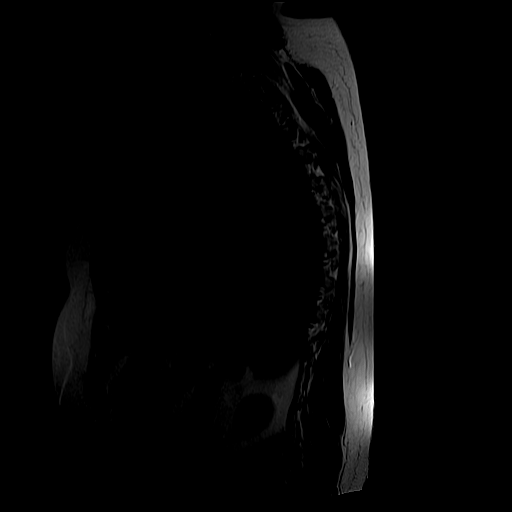

[Series 9: T1 · sagittal · 4.0mm · 0.78mm/px · 6 of 13 slices shown (1 of 2)]
[im 1/13]
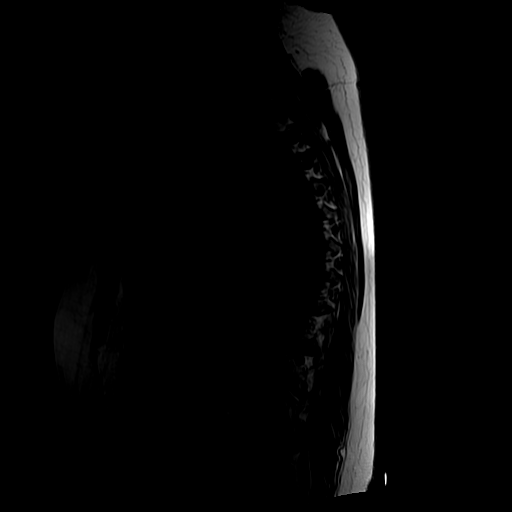
[im 2/13]
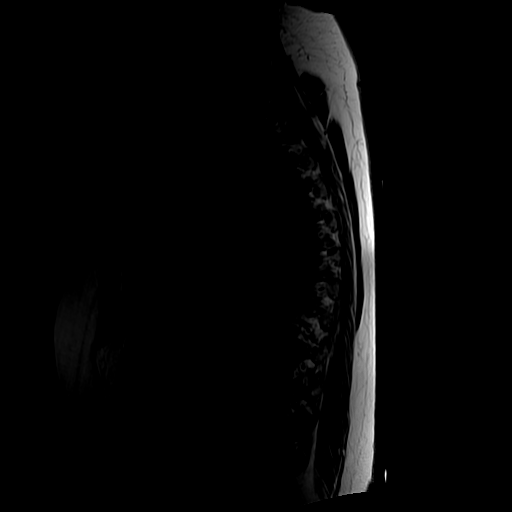
[im 5/13]
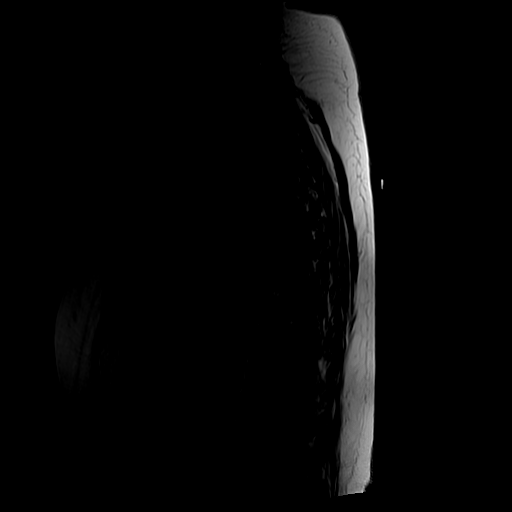
[im 6/13]
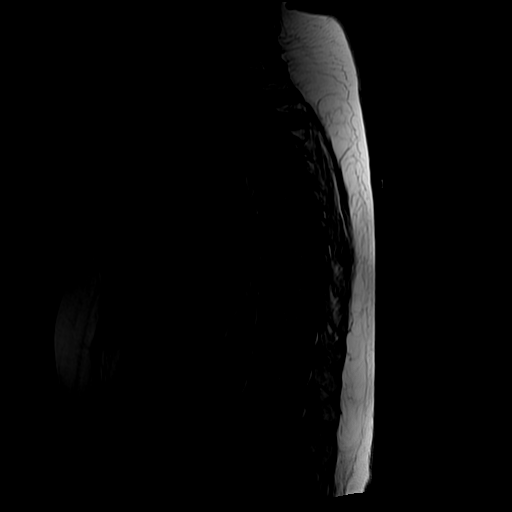
[im 7/13]
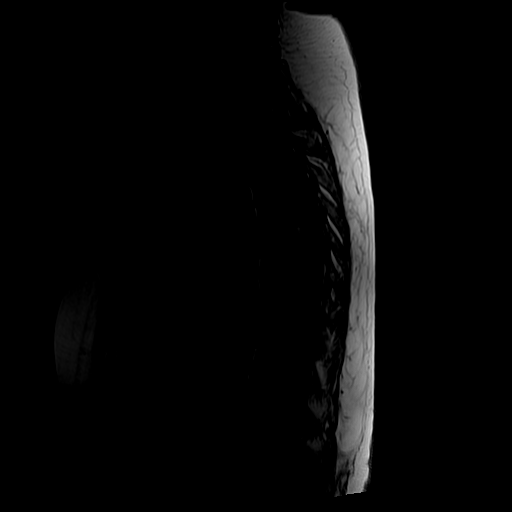
[im 11/13]
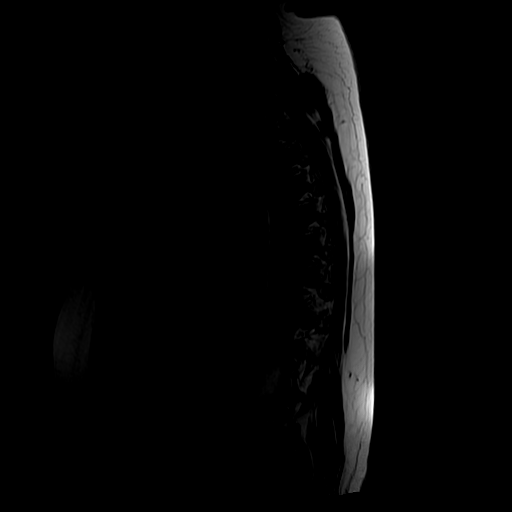

[Series 12: T2 · axial · 4.0mm · 0.62mm/px · z∈[-322,-133]mm · 9 of 12 slices shown (2 of 2)]
[im 1/12]
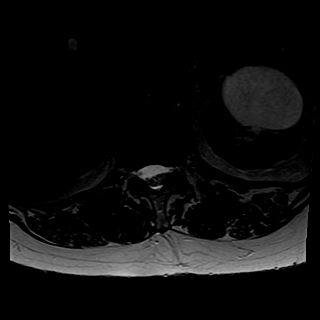
[im 2/12]
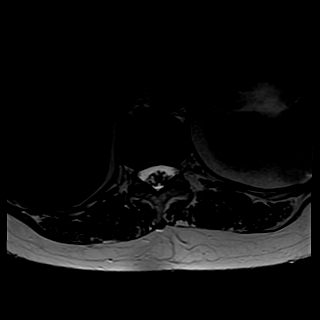
[im 3/12]
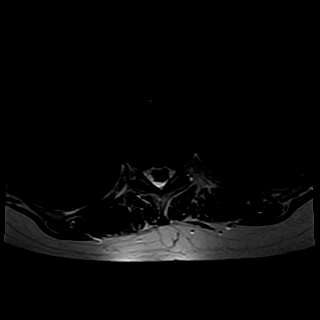
[im 5/12]
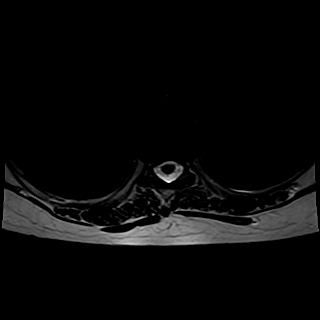
[im 6/12]
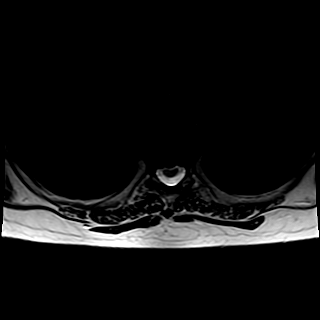
[im 7/12]
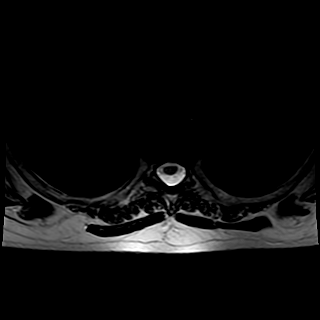
[im 9/12]
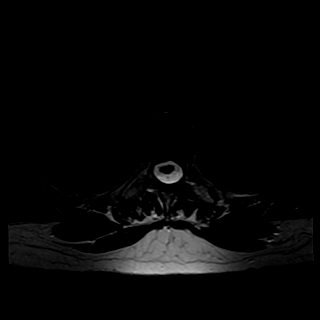
[im 10/12]
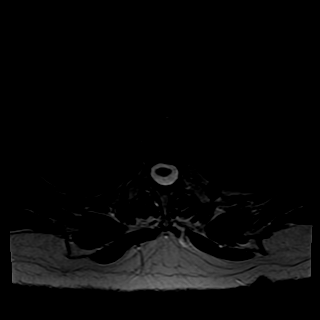
[im 12/12]
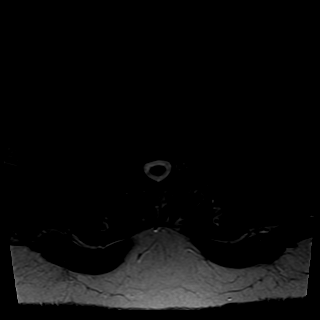

[Series 13: T1 · axial · 4.0mm · 0.62mm/px · z∈[-292,-171]mm · 3 of 12 slices shown (2 of 2)]
[im 2/12]
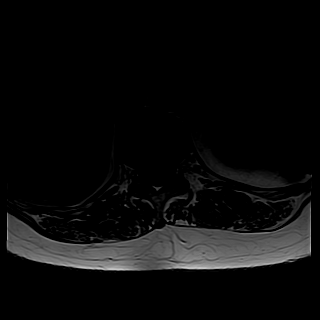
[im 6/12]
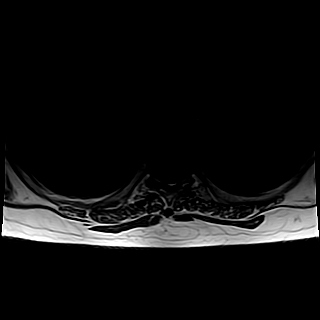
[im 10/12]
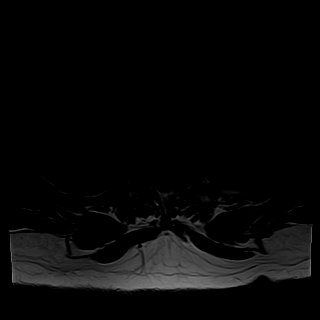

[26 of 48 positions shown; findings below may reference images not displayed]

FINDINGS: Vertebral bodies are normal in height, alignment and signal intensity. There is no acute fracture or subluxation. Visualized spinal cord is also normal in signal intensity without evidence of compression at any level.

There are small central disc bulges at T7-8 and T10-11 levels with mild indentation of the ventral cord at both levels. No significant spinal canal or neural foraminal stenosis is seen at any level.

Paraspinal soft tissues are also unremarkable. There is no pleural effusion.
IMPRESSION: Degenerative changes as detailed above.

## 2020-03-17 IMAGING — MR MRI LUMBAR SPINE WITHOUT CONTRAST
6 series · 42 of 48 positions shown · IV contrast (gadolinium)
Comparison: None available.

EXAM:  MRI LUMBAR SPINE WITHOUT CONTRAST
INDICATION: Lower back pain.
TECHNIQUE: Multiplanar multisequential MRI of the lumbar spine was performed without gadolinium contrast.

[Series 5: T2 · sagittal · 4.0mm · 0.94mm/px · 8 of 17 slices shown (1 of 3)]
[im 1/17]
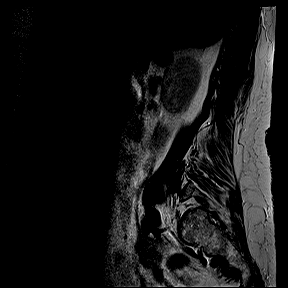
[im 3/17]
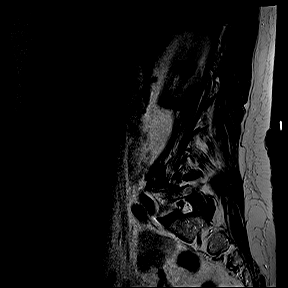
[im 5/17]
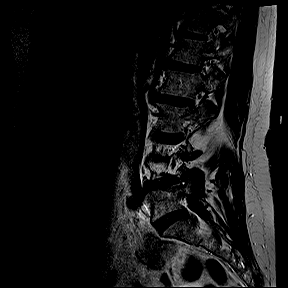
[im 7/17]
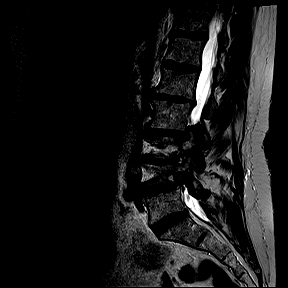
[im 10/17]
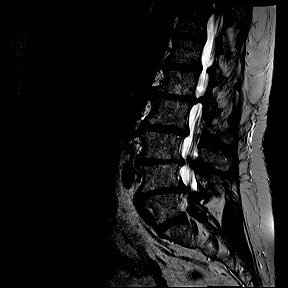
[im 12/17]
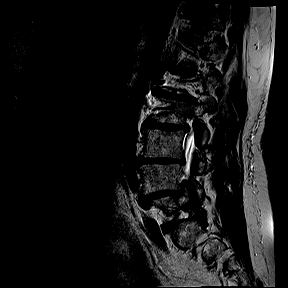
[im 14/17]
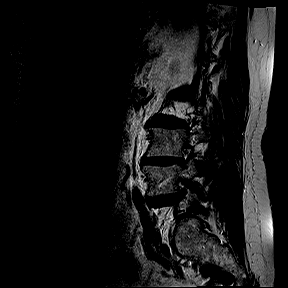
[im 17/17]
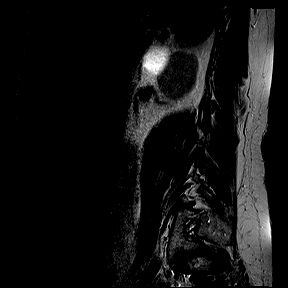

[Series 6: T1 · sagittal · 4.0mm · 0.94mm/px · 7 of 17 slices shown (1 of 2)]
[im 1/17]
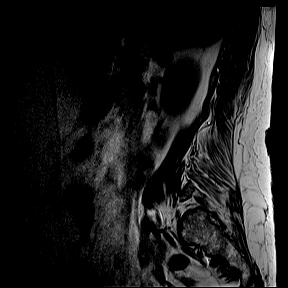
[im 3/17]
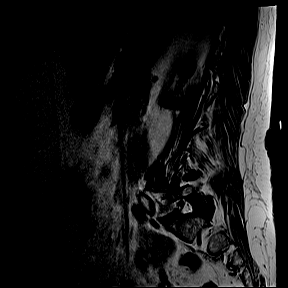
[im 6/17]
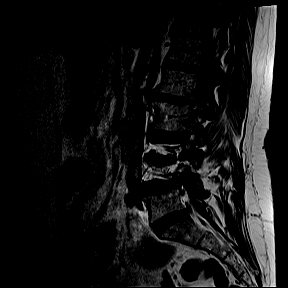
[im 9/17]
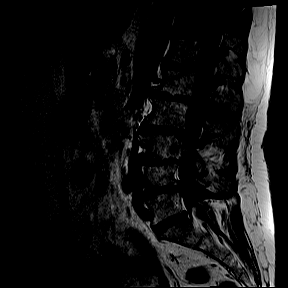
[im 11/17]
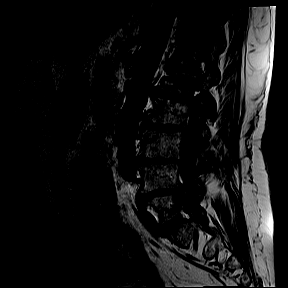
[im 14/17]
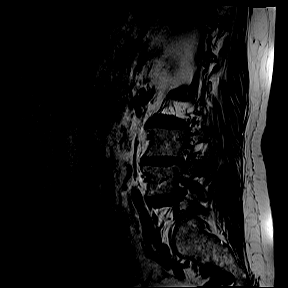
[im 17/17]
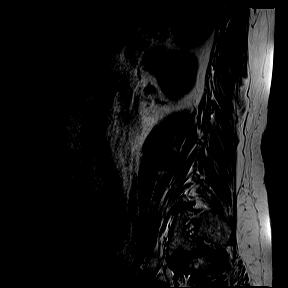

[Series 7: STIR · sagittal · 4.0mm · 1.05mm/px · 2 of 17 slices shown]
[im 1/17]
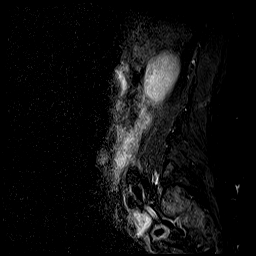
[im 3/17]
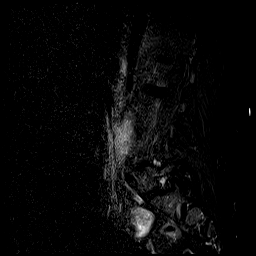

[Series 8: T2 · coronal · 4.0mm · 0.90mm/px · 8 of 20 slices shown (2 of 3)]
[im 1/20]
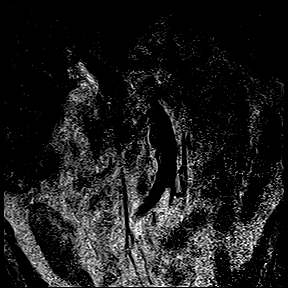
[im 3/20]
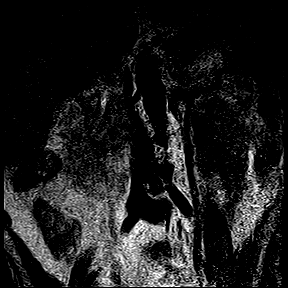
[im 6/20]
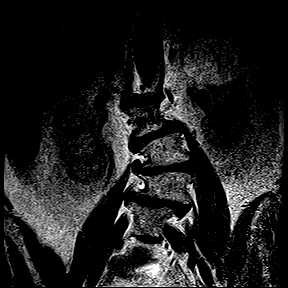
[im 9/20]
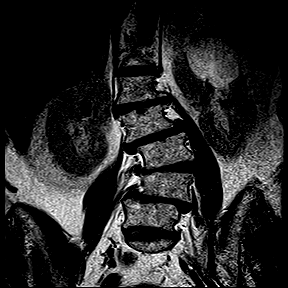
[im 11/20]
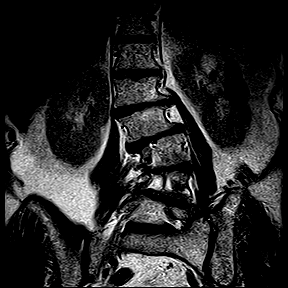
[im 14/20]
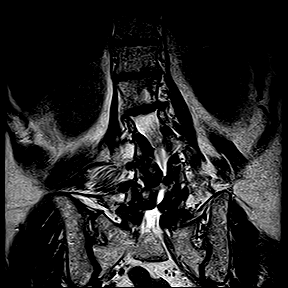
[im 17/20]
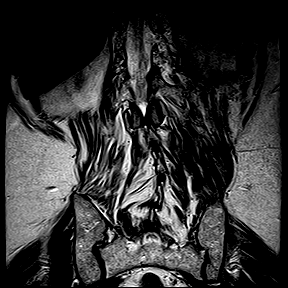
[im 20/20]
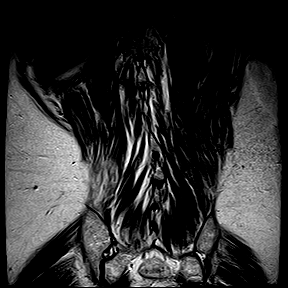

[Series 9: T2 · axial · 4.0mm · 0.47mm/px · z∈[-80,+71]mm · 9 of 23 slices shown (3 of 3)]
[im 1/23]
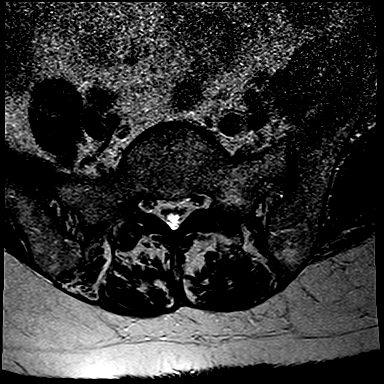
[im 3/23]
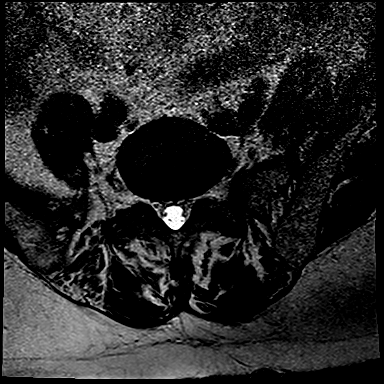
[im 6/23]
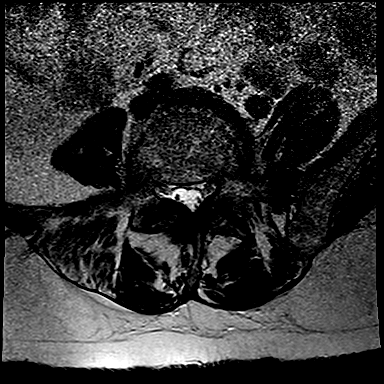
[im 9/23]
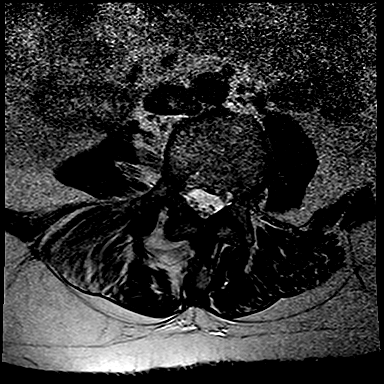
[im 12/23]
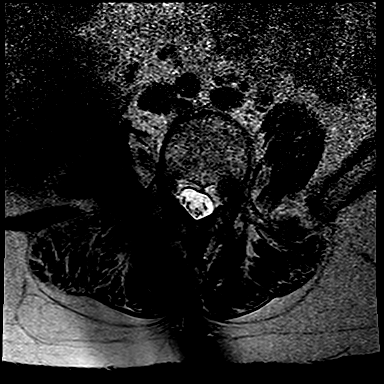
[im 14/23]
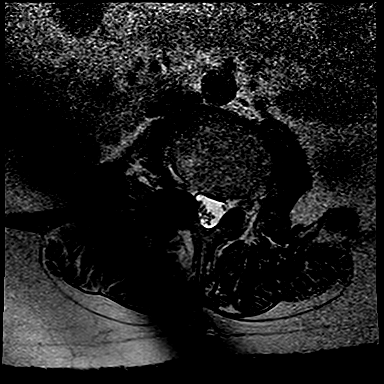
[im 17/23]
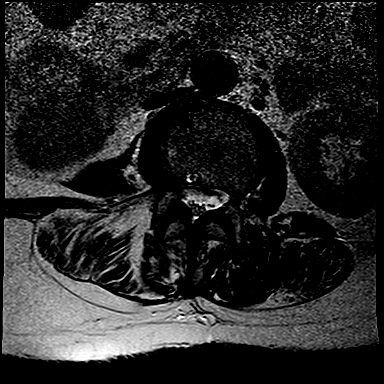
[im 20/23]
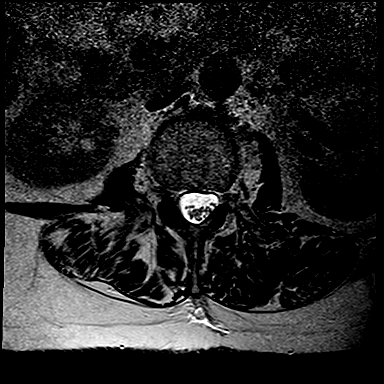
[im 23/23]
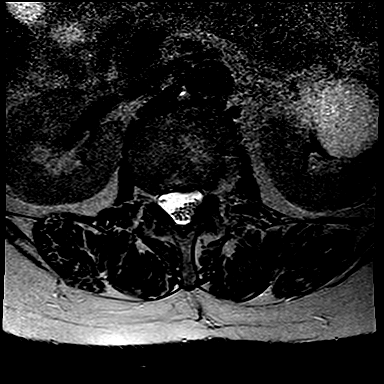

[Series 10: T1 · axial · 4.0mm · 0.47mm/px · z∈[-80,+71]mm · 8 of 23 slices shown (2 of 2)]
[im 1/23]
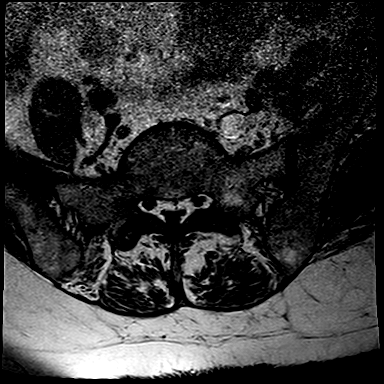
[im 3/23]
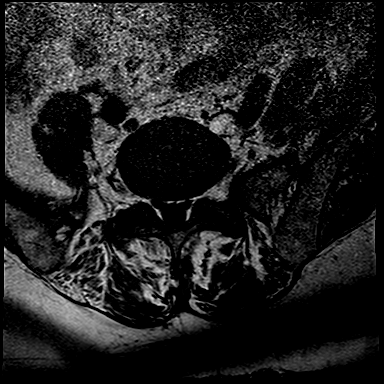
[im 6/23]
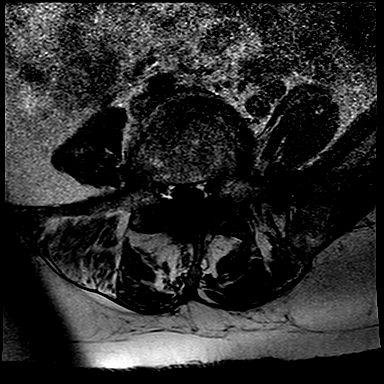
[im 9/23]
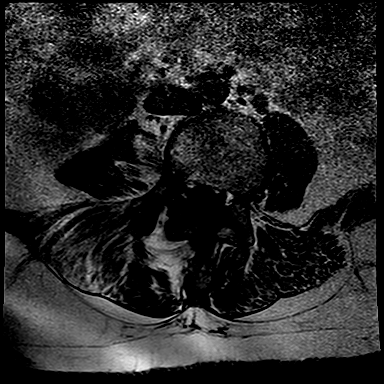
[im 14/23]
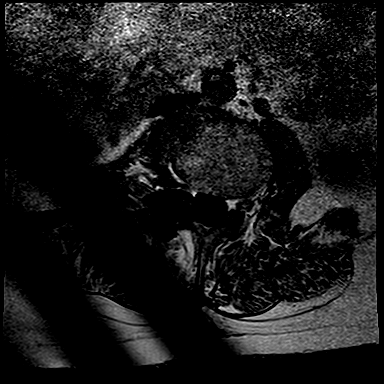
[im 17/23]
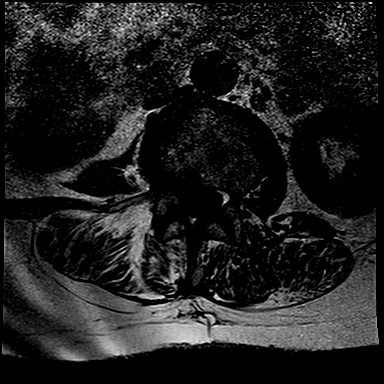
[im 20/23]
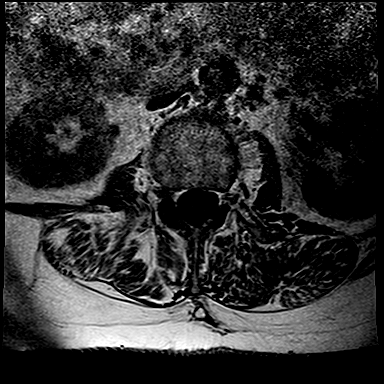
[im 23/23]
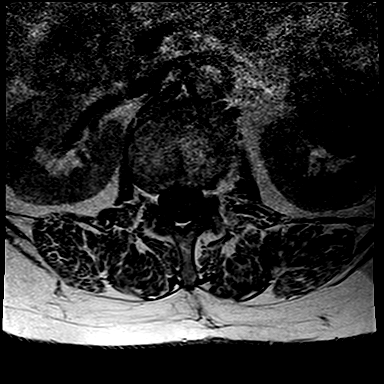

[42 of 48 positions shown; findings below may reference images not displayed]

FINDINGS: There is a moderate levoscoliosis centered at L3-4 disc space level. Bone marrow signal intensity is normal. There is no acute fracture or subluxation. Distal spinal cord is also normal in signal intensity and terminates normally at T11-12 disc space level. Moderate to marked disc desiccation is seen at most levels. Spinal canal is congenitally narrow.

At L1-2 level, there is a small broad-based central disc bulge with associated asymmetric facet arthropathy resulting in severe left lateral recess compression. There is moderate left and mild right neural foraminal stenosis from facet arthropathy and bulging annulus.

At L2-3 level, there is a small broad-based central disc bulge with asymmetric facet arthropathy resulting in mild-to-moderate spinal stenosis and severe right lateral recess compression. There is severe right and mild-to-moderate left neural foraminal stenosis from facet arthropathy and bulging annulus.

At L3-4 level, there is a small central disc bulge with associated asymmetric facet arthropathy resulting in severe right lateral recess compression. There is moderate left and moderate to severe right neural foraminal stenosis from facet arthropathy and bulging annulus.

At L4-5 level, there is a small broad-based central disc bulge resulting in severe spinal stenosis. There is severe bilateral neural foraminal stenosis from facet arthropathy and bulging annulus.

At L5-S1 level, there is a minimal bulging annulus without mass effect on the thecal sac. There is mild-to-moderate bilateral neural foraminal stenosis from facet arthropathy and bulging annulus.

Paraspinal soft tissues are unremarkable.
IMPRESSION: Moderate levoscoliosis centered at L3-4 disc space level. 

Severe spinal stenosis at L4-5 level from a small central disc bulge. 

Severe left lateral recess compression at L1-2 level and right lateral recess compression at L2-3 and L3-4 levels from central disc bulges and asymmetric facet arthropathy. 

Multilevel neural foraminal stenosis as detailed above.

## 2021-05-06 IMAGING — MG 3D SCREENING MAMMO BIL W/CAD & TOMO
5 series · 7 of 24 positions shown · non-contrast
Comparison: 10/01/2016

------------- REPORT [id number] -------------
[facility]
[address]
Tiger Ms.GORBARAN, GERCHU:
We wish to report the following on your recent mammography examination. We are sending a report to your referring physician or other health care provider. 
(       Normal/Negative:
No evidence of cancer.
This statement is mandated by the [place], Department of Health.
Your examination was performed by one of our technologists, who are registered radiological technologists and also specially certified in mammography:
___
Markland, Marjuan (M)

Your mammogram was interpreted by our radiologist.
( 
Collette Sedman, M.D.
(Annual Breast Examination by a physician or other health care provider
(Annual Mammography Screening beginning at age 40
(Monthly Breast Self Examination
------------- REPORT [id number] -------------
﻿
EXAM:  3D BILATERAL ANNUAL SCREENING DIGITAL MAMMOGRAM WITH CAD AND TOMOSYNTHESIS
INDICATION: Screening.

[R]
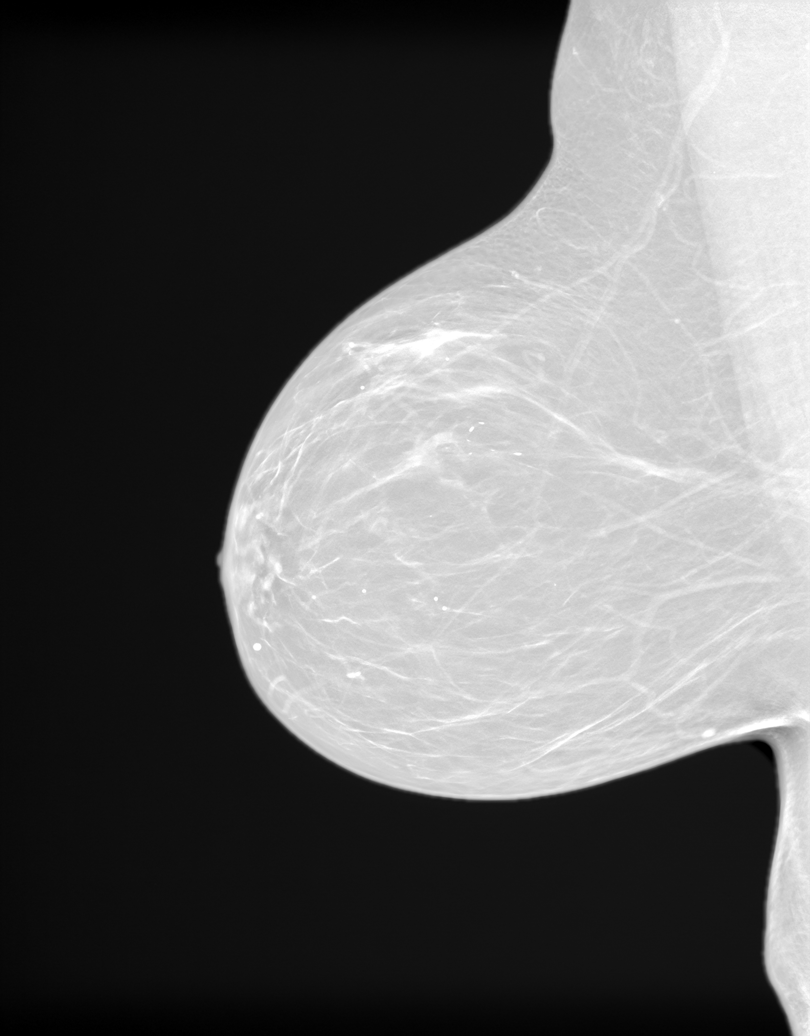

[L]
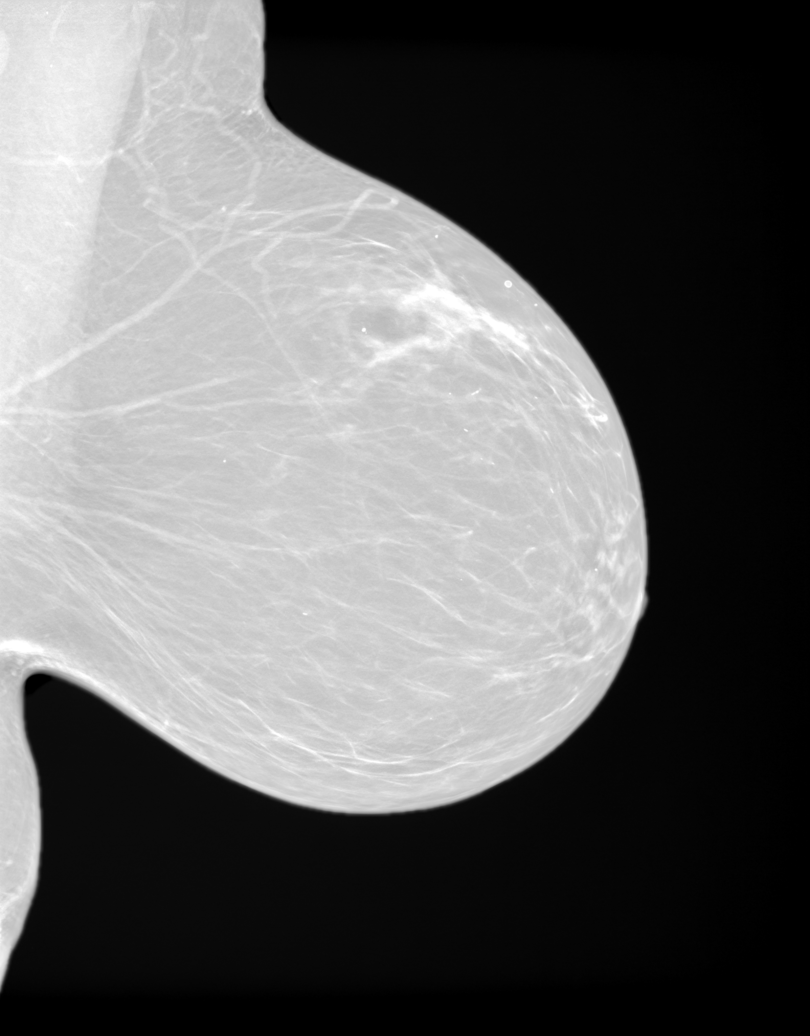

[R CC tomo · right · 0.10mm/px · 2 of 2 slices shown]
[im 1/2]
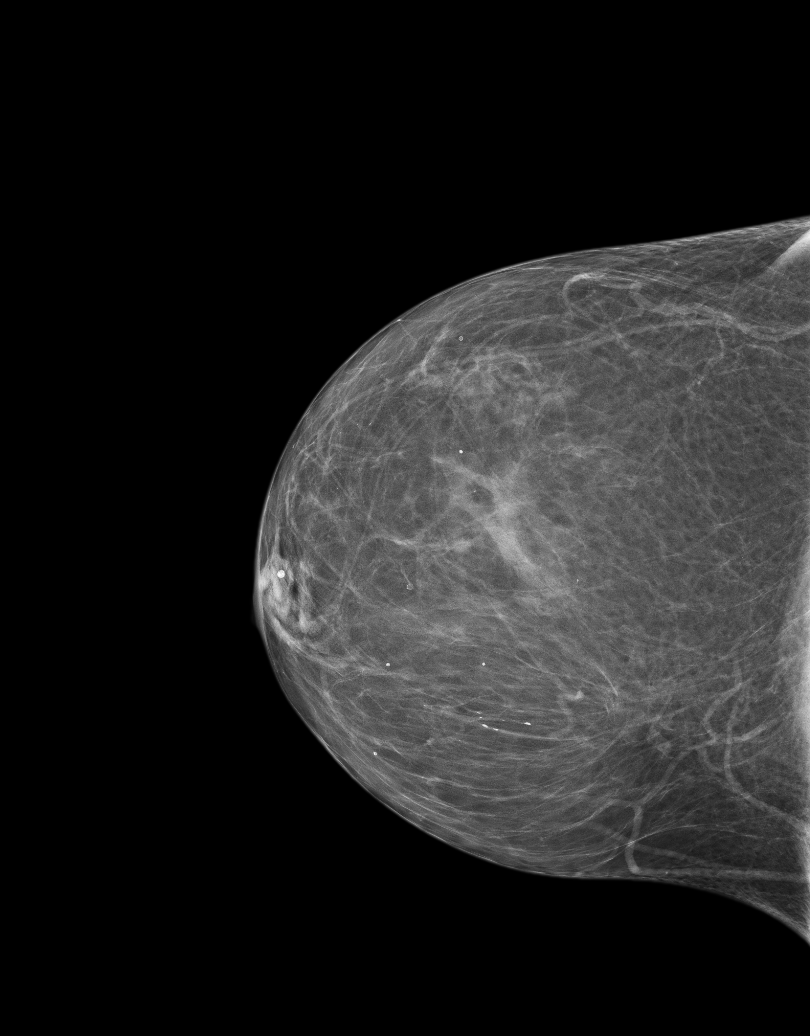
[im 2/2]
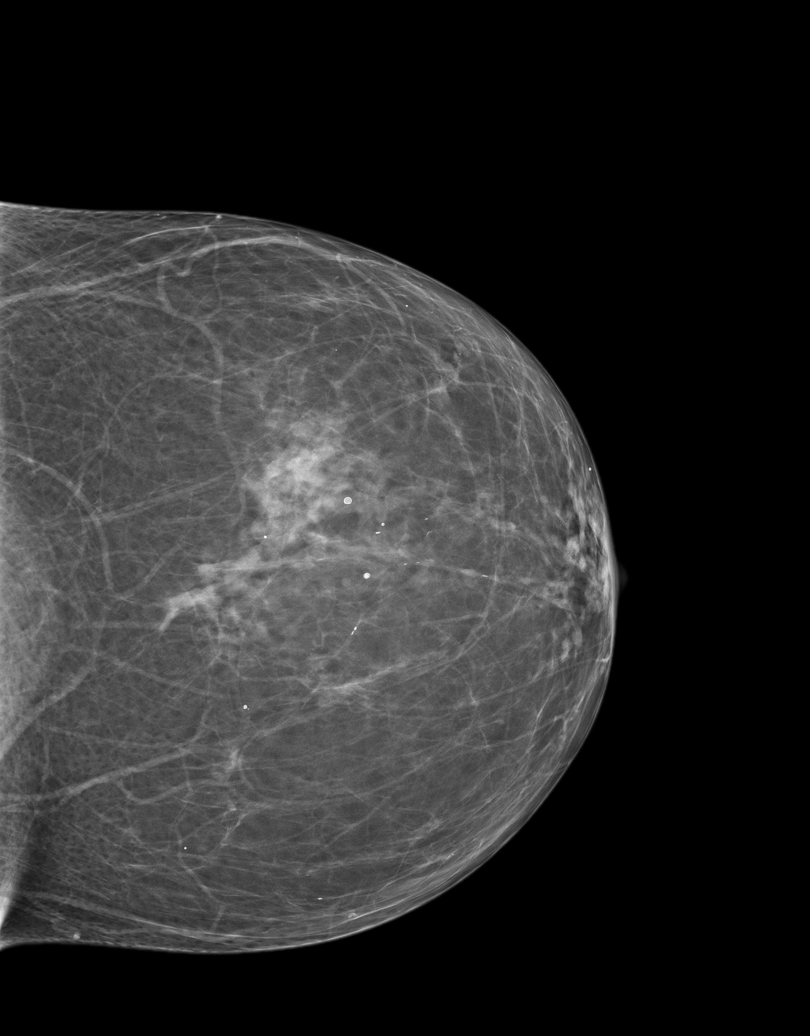

[3D SCREENING MAMMO BIL W/CAD & TOMO tomo · 2 acquisitions, 2 frames shown (1 of 2)]
[im 1/2]
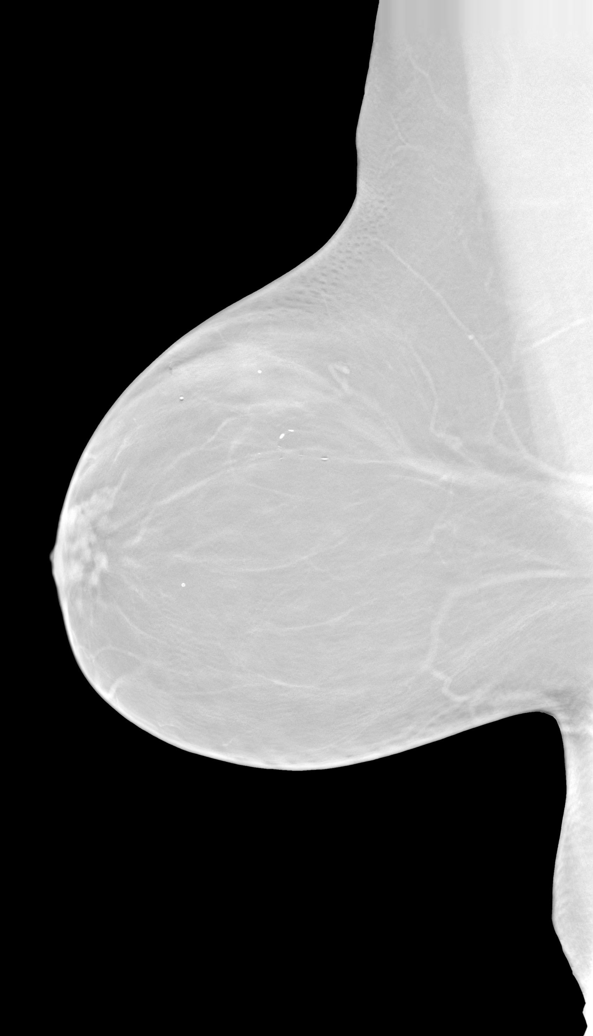
[im 2/2]
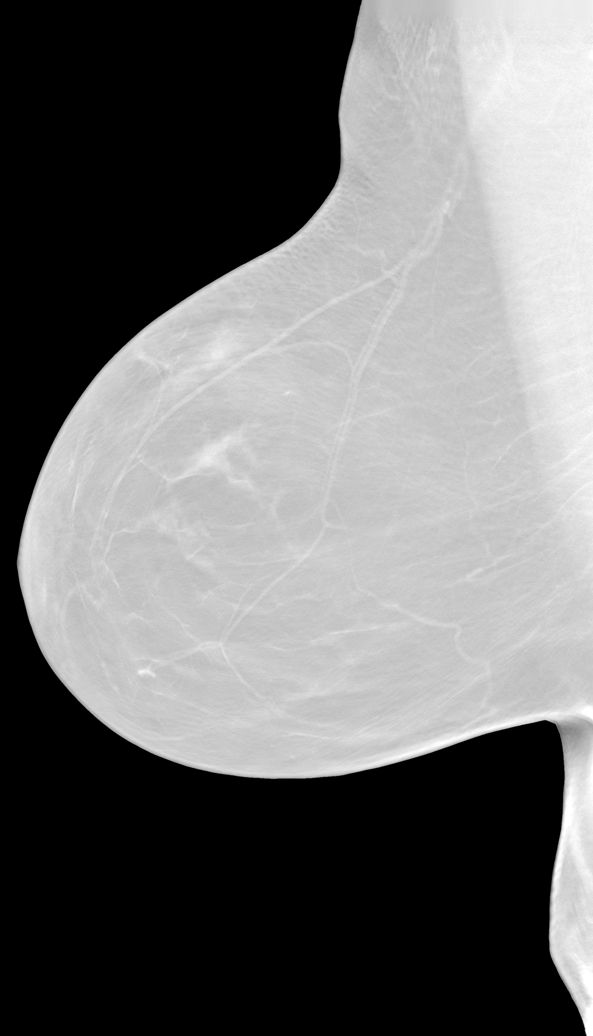

[3D SCREENING MAMMO BIL W/CAD & TOMO tomo (2 of 2) · tomo slice 10/64.0]
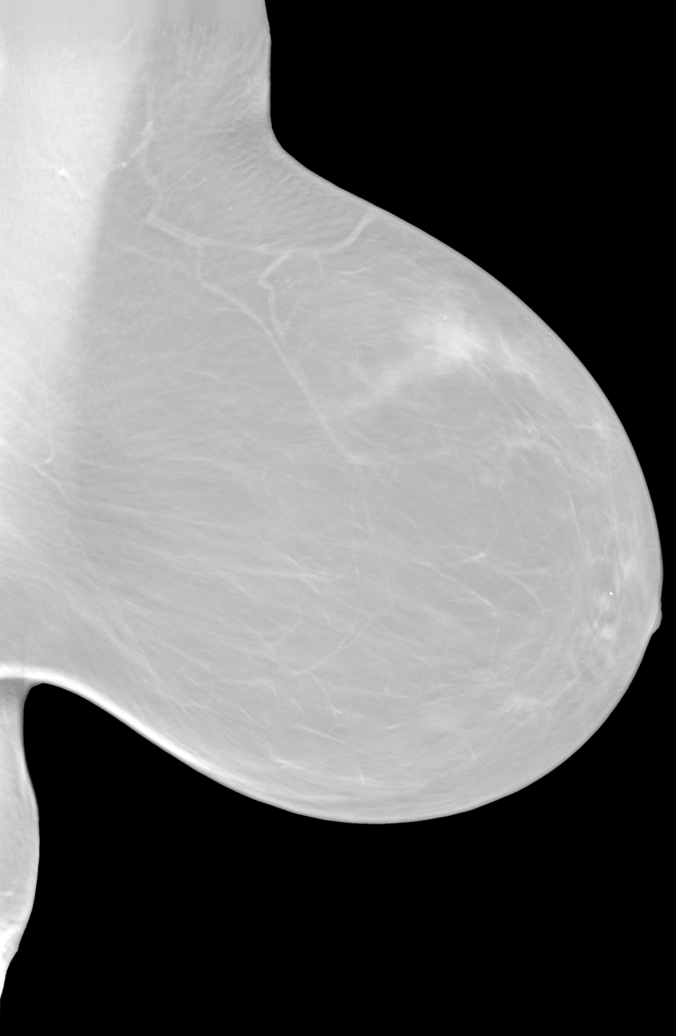

[7 of 24 positions shown; findings below may reference images not displayed]

FINDINGS: There are scattered fibroglandular elements.  There is no mass or suspicious cluster of microcalcifications.   There is no architectural distortion, skin thickening or nipple retraction.
IMPRESSION: 1.  BIRADS 2-Benign findings. Patient has been added in a reminder system with a target date for the next screening mammography.

2.  DENSITY CODE – B (Scattered areas of fibroglandular density). 

Final Assessment Code:

Bi-Rads 2 

BI-RADS 0
 Need additional imaging evaluation.

BI-RADS 1
 Negative mammogram.

BI-RADS 2
 Benign finding.

BI-RADS 3
 Probably benign finding; short-interval follow-up suggested.

BI-RADS 4
 Suspicious abnormality; biopsy should be considered.

BI-RADS 5
 Highly suggestive of malignancy; appropriate action should be taken.

BI-RADS 6
 Known biopsy-proven malignancy; appropriate action should be taken.

NOTE:
In compliance with Federal regulations, the results of this mammogram are being sent to the patient.

## 2021-06-13 DIAGNOSIS — M48062 Spinal stenosis, lumbar region with neurogenic claudication: Secondary | ICD-10-CM | POA: Insufficient documentation

## 2021-06-13 DIAGNOSIS — M4150 Other secondary scoliosis, site unspecified: Secondary | ICD-10-CM | POA: Insufficient documentation

## 2021-12-05 ENCOUNTER — Encounter (INDEPENDENT_AMBULATORY_CARE_PROVIDER_SITE_OTHER): Payer: Self-pay | Admitting: Family Medicine

## 2021-12-05 DIAGNOSIS — M50123 Cervical disc disorder at C6-C7 level with radiculopathy: Secondary | ICD-10-CM

## 2021-12-05 DIAGNOSIS — Z720 Tobacco use: Secondary | ICD-10-CM | POA: Insufficient documentation

## 2021-12-05 DIAGNOSIS — R590 Localized enlarged lymph nodes: Secondary | ICD-10-CM

## 2021-12-05 DIAGNOSIS — G9520 Unspecified cord compression: Secondary | ICD-10-CM | POA: Insufficient documentation

## 2021-12-05 DIAGNOSIS — M5127 Other intervertebral disc displacement, lumbosacral region: Secondary | ICD-10-CM

## 2021-12-05 DIAGNOSIS — Z8739 Personal history of other diseases of the musculoskeletal system and connective tissue: Secondary | ICD-10-CM | POA: Insufficient documentation

## 2021-12-05 DIAGNOSIS — K219 Gastro-esophageal reflux disease without esophagitis: Secondary | ICD-10-CM

## 2021-12-05 DIAGNOSIS — J449 Chronic obstructive pulmonary disease, unspecified: Secondary | ICD-10-CM

## 2021-12-05 DIAGNOSIS — M419 Scoliosis, unspecified: Secondary | ICD-10-CM | POA: Insufficient documentation

## 2021-12-05 DIAGNOSIS — M4135 Thoracogenic scoliosis, thoracolumbar region: Secondary | ICD-10-CM | POA: Insufficient documentation

## 2021-12-20 ENCOUNTER — Encounter (INDEPENDENT_AMBULATORY_CARE_PROVIDER_SITE_OTHER): Payer: Self-pay | Admitting: Surgery

## 2021-12-20 ENCOUNTER — Ambulatory Visit (INDEPENDENT_AMBULATORY_CARE_PROVIDER_SITE_OTHER): Payer: Medicare Other | Admitting: Surgery

## 2021-12-20 ENCOUNTER — Ambulatory Visit (INDEPENDENT_AMBULATORY_CARE_PROVIDER_SITE_OTHER): Payer: Medicare Other

## 2021-12-20 ENCOUNTER — Other Ambulatory Visit: Payer: Medicare Other | Attending: Family Medicine | Admitting: Family Medicine

## 2021-12-20 ENCOUNTER — Other Ambulatory Visit: Payer: Self-pay

## 2021-12-20 VITALS — BP 131/68 | HR 87 | Resp 18 | Ht 62.0 in | Wt 167.0 lb

## 2021-12-20 DIAGNOSIS — R399 Unspecified symptoms and signs involving the genitourinary system: Secondary | ICD-10-CM | POA: Insufficient documentation

## 2021-12-20 DIAGNOSIS — I10 Essential (primary) hypertension: Secondary | ICD-10-CM

## 2021-12-20 DIAGNOSIS — R590 Localized enlarged lymph nodes: Secondary | ICD-10-CM

## 2021-12-20 DIAGNOSIS — E785 Hyperlipidemia, unspecified: Secondary | ICD-10-CM | POA: Insufficient documentation

## 2021-12-20 LAB — URINALYSIS, MICROSCOPIC
HYALINE CASTS: 11 /lpf — ABNORMAL HIGH (ref <0)
RBCS: 1 /hpf (ref <4)
SQUAMOUS EPITHELIAL: 6 /hpf (ref <28)
WBCS: 9 /hpf — ABNORMAL HIGH (ref <6)

## 2021-12-20 LAB — CBC
HCT: 41.7 % (ref 37.0–47.0)
HGB: 14.4 g/dL (ref 12.5–16.0)
MCH: 31.1 pg (ref 27.0–32.0)
MCHC: 34.6 g/dL (ref 32.0–36.0)
MCV: 90 fL (ref 78.0–99.0)
MPV: 11.1 fL — ABNORMAL HIGH (ref 7.4–10.4)
PLATELETS: 195 10*3/uL (ref 140–440)
RBC: 4.63 10*6/uL (ref 4.20–5.40)
RDW: 15.3 % — ABNORMAL HIGH (ref 11.6–14.8)
WBC: 10.2 10*3/uL (ref 4.0–10.5)
WBCS UNCORRECTED: 10.2 10*3/uL

## 2021-12-20 LAB — URINALYSIS, MACROSCOPIC
BILIRUBIN: NEGATIVE mg/dL
BLOOD: NEGATIVE mg/dL
GLUCOSE: NEGATIVE mg/dL
LEUKOCYTES: 250 WBCs/uL — AB
NITRITE: NEGATIVE
PH: 5.5 (ref 5.0–9.0)
PROTEIN: 30 mg/dL — AB
SPECIFIC GRAVITY: 1.028 (ref 1.002–1.030)
UROBILINOGEN: 3 mg/dL — AB

## 2021-12-20 LAB — HEPATIC FUNCTION PANEL
ALBUMIN/GLOBULIN RATIO: 1.4 (ref 0.8–1.4)
ALBUMIN: 4.3 g/dL (ref 3.5–5.7)
ALKALINE PHOSPHATASE: 91 U/L (ref 34–104)
ALT (SGPT): 21 U/L (ref 7–52)
AST (SGOT): 14 U/L (ref 13–39)
BILIRUBIN DIRECT: 0.03 md/dL (ref <=0.20)
BILIRUBIN TOTAL: 0.5 mg/dL (ref 0.3–1.2)
BILIRUBIN, INDIRECT: 0.47 mg/dL (ref <=1)
GLOBULIN: 3 (ref 2.9–5.4)
PROTEIN TOTAL: 7.3 g/dL (ref 6.4–8.9)

## 2021-12-20 LAB — BASIC METABOLIC PANEL
ANION GAP: 5 mmol/L — ABNORMAL LOW (ref 10–20)
BUN/CREA RATIO: 19 (ref 6–22)
BUN: 16 mg/dL (ref 7–25)
CALCIUM: 9.7 mg/dL (ref 8.6–10.3)
CHLORIDE: 107 mmol/L (ref 98–107)
CO2 TOTAL: 26 mmol/L (ref 21–31)
CREATININE: 0.84 mg/dL (ref 0.60–1.30)
ESTIMATED GFR: 73 mL/min/{1.73_m2} (ref >59)
GLUCOSE: 109 mg/dL (ref 74–109)
OSMOLALITY, CALCULATED: 278 mOsm/kg (ref 270–290)
POTASSIUM: 4.4 mmol/L (ref 3.5–5.1)
SODIUM: 138 mmol/L (ref 136–145)

## 2021-12-20 LAB — LIPID PANEL
CHOL/HDL RATIO: 4.7
CHOLESTEROL: 180 mg/dL (ref 136–290)
HDL CHOL: 38 mg/dL (ref 23–92)
LDL CALC: 118 mg/dL — ABNORMAL HIGH (ref 0–100)
TRIGLYCERIDES: 122 mg/dL (ref <=150)
VLDL CALC: 24 mg/dL (ref 0–50)

## 2021-12-20 LAB — THYROID STIMULATING HORMONE (SENSITIVE TSH): TSH: 0.594 u[IU]/mL (ref 0.450–5.330)

## 2021-12-20 NOTE — Progress Notes (Signed)
GENERAL SURGERY, [facility] GENER[facility]  [address]  [place] [address]    History and Physical     Name: Pamela Christensen MRN:  [id number]   Date: 12/20/2021 Age: 76 y.o.       Chief Complaint: Cervical Lymphadenopathy (3 month follow up cervical/lmphadenopathy)    History of Present Illness   Pamela Christensen is a 76 y.o. year old Pamela who comes to clinic with no new complaints    Patient Active Problem List    Diagnosis   . Thoracogenic scoliosis, thoracolumbar region   . H/O degenerative disc disease   . Anterior cervical lymphadenopathy   . Scoliosis of lumbosacral spine   . COPD (chronic obstructive pulmonary disease) (CMS HCC)   . Tobacco abuse disorder   . Herniated nucleus pulposus, L5-S1, right   . Compression of spinal cord (CMS HCC)   . 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 Gastroesophageal reflux disease     Past Medical History:   Diagnosis Date   . Chronic pain    . 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 Greater trochanteric bursitis of both hips    . Mixed hyperlipidemia    . Peripheral vascular disease, unspecified (CMS HCC)          Past Surgical History:   Procedure Laterality Date   . BREAST SURGERY      tumor removal   . HX APPENDECTOMY     . HX TUMOR REMOVAL      head   . SHOULDER SURGERY      tumor removal         Current Outpatient Medications   Medication Sig   . albuterol sulfate (PROVENTIL OR VENTOLIN OR PROAIR) 90 mcg/actuation Inhalation oral inhaler Take 1 Puff by inhalation Four times a day   . aspirin 81 mg Oral Tablet, Chewable Chew 1 Tablet (81 mg total) Once a day   . atorvastatin (LIPITOR) 20 mg Oral Tablet Take 1 Tablet (20 mg total) by mouth Every evening   . clotrimazole-betamethasone (LOTRISONE) 1-0.05 % Cream Apply topically Twice daily   . diclofenac sodium (VOLTAREN) 75 mg Oral Tablet, Delayed Release (E.C.) Take 1 Tablet (75 mg total) by mouth Twice daily   . Epinephrine Base 0.3 mg/0.3 mL Injection Syringe Inject 0.3 mL (0.3 mg  total) as directed Every 15 minutes Q5-67m PRN   . ergocalciferol, vitamin D2, (DRISDOL) 1,250 mcg (50,000 unit) Oral Capsule Take 1 Capsule (50,000 Units total) by mouth Every 7 days   . guaiFENesin (MUCINEX) 600 mg Oral Tablet Extended Release 12hr Take 2 Tablets (1,200 mg total) by mouth   . pregabalin (LYRICA) 25 mg Oral Capsule Take 3 Capsules (75 mg total) by mouth Twice daily   . pregabalin (LYRICA) 75 mg Oral Capsule    . promethazine (PHENERGAN) 25 mg Oral Tablet Take 1 Tablet (25 mg total) by mouth Every morning after breakfast   . RABEprazole (ACIPHEX) 20 mg Oral Tablet, Delayed Release (E.C.) Take 1 Tablet (20 mg total) by mouth Once a day   . traMADoL (ULTRAM) 50 mg Oral Tablet Take by mouth Three times a day as needed for Pain     Allergies   Allergen Reactions   . Bee Venom Protein (Honey Bee) Anaphylaxis     Family Medical History:     Problem Relation (Age of Onset)    Breast Cancer Sister    Dementia Father  Lung Cancer Sister    Parkinsons Disease Mother    Prostate Cancer Father          Social History     Tobacco Use   . Smoking status: Every Day     Types: Cigarettes   . Smokeless tobacco: Never   Substance Use Topics   . Alcohol use: Never      Review of Systems  Negative   Examination:  BP 131/68 (Site: Left, Patient Position: Sitting)   Pulse 87   Resp 18   Ht 1.575 m (5\' 2" )   Wt 75.8 kg (167 lb)   SpO2 97%   BMI 30.54 kg/m       HEENT normal  Left cervical lymph node not markedly enlarged  Lungs clear  CV RRR  Abdomen soft  Extremities normal  Neuro normal  Data reviewed:    Assessment and Plan  Problem List Items Addressed This Visit    None  Visit Diagnoses     Lymphadenopathy of left cervical region    -  Primary        RTC 3 months  No biopsy recommended  Call prn    Pamela Christensen B Pamela Iafrate, MD

## 2021-12-21 ENCOUNTER — Ambulatory Visit (INDEPENDENT_AMBULATORY_CARE_PROVIDER_SITE_OTHER): Payer: Medicare Other | Admitting: Family Medicine

## 2021-12-21 ENCOUNTER — Encounter (INDEPENDENT_AMBULATORY_CARE_PROVIDER_SITE_OTHER): Payer: Self-pay | Admitting: Family Medicine

## 2021-12-21 VITALS — BP 145/76 | HR 88 | Temp 98.6°F | Resp 18 | Ht 62.0 in | Wt 170.0 lb

## 2021-12-21 DIAGNOSIS — Z8739 Personal history of other diseases of the musculoskeletal system and connective tissue: Secondary | ICD-10-CM

## 2021-12-21 DIAGNOSIS — K219 Gastro-esophageal reflux disease without esophagitis: Secondary | ICD-10-CM

## 2021-12-21 DIAGNOSIS — M5127 Other intervertebral disc displacement, lumbosacral region: Secondary | ICD-10-CM

## 2021-12-21 DIAGNOSIS — Z720 Tobacco use: Secondary | ICD-10-CM

## 2021-12-21 DIAGNOSIS — E782 Mixed hyperlipidemia: Secondary | ICD-10-CM

## 2021-12-21 DIAGNOSIS — G9520 Unspecified cord compression: Secondary | ICD-10-CM

## 2021-12-21 DIAGNOSIS — M4135 Thoracogenic scoliosis, thoracolumbar region: Secondary | ICD-10-CM

## 2021-12-21 DIAGNOSIS — J449 Chronic obstructive pulmonary disease, unspecified: Secondary | ICD-10-CM

## 2021-12-21 LAB — URINE CULTURE,ROUTINE: URINE CULTURE: NO GROWTH

## 2021-12-21 NOTE — Progress Notes (Signed)
FAMILY MEDICINE, MEDICAL OFFICE BUILDING  [place] [address]       Name: Pamela Christensen MRN:  [id number]   Date: 12/21/2021 Age: 76 y.o.          Provider: Elliot Gault, DO    Reason for visit: Follow Up 3 Months (CDM)      History of Present Illness:  December 21, 2021:  This 77 year old female returns for 3 month follow-up to review her labs and get refills on all of her medications.  According to our records she did gain 2 lb since she was here last.  She is going to go to [facility] on Wednesday and will see Dr. Harl Bowie.  She states that Lyrica is helping especially the tingling in her legs.  She has right-sided sciatica and also had cervical disc disease with spinal stenosis but at this time has declined to have surgery or injections.  Her CBC was normal liver enzymes are normal electrolytes normal with a glucose of 109 calcium was 9.7 and normal triglycerides were 122 total cholesterol 180 HDL 38 LDL 110.  Patient continues to smoke and does not seem to be interested in quitting.  At this time she denies chest pain or shortness of breath but is having severe pain in the right hip over the greater trochanteric bursa and in her low back.  Historical Data    Past Medical History:  Past Medical History:   Diagnosis Date   . Chronic pain    . 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 Greater trochanteric bursitis of both hips    . Mixed hyperlipidemia    . Peripheral vascular disease, unspecified (CMS [place])          Past Surgical History:  Past Surgical History:   Procedure Laterality Date   . BREAST SURGERY      tumor removal   . HX APPENDECTOMY     . HX TUMOR REMOVAL      head   . SHOULDER SURGERY      tumor removal         Allergies:  Allergies   Allergen Reactions   . Bee Venom Protein (Honey Bee) Anaphylaxis     Medications:  Current Outpatient Medications   Medication Sig   . albuterol sulfate (PROVENTIL OR VENTOLIN OR PROAIR) 90 mcg/actuation Inhalation oral inhaler Take 1 Puff by  inhalation Four times a day   . aspirin 81 mg Oral Tablet, Chewable Chew 1 Tablet (81 mg total) Once a day   . atorvastatin (LIPITOR) 20 mg Oral Tablet Take 1 Tablet (20 mg total) by mouth Every evening   . clotrimazole-betamethasone (LOTRISONE) 1-0.05 % Cream Apply topically Twice daily   . diclofenac sodium (VOLTAREN) 75 mg Oral Tablet, Delayed Release (E.C.) Take 1 Tablet (75 mg total) by mouth Twice daily   . Epinephrine Base 0.3 mg/0.3 mL Injection Syringe Inject 0.3 mL (0.3 mg total) as directed Every 15 minutes Q5-30m PRN   . ergocalciferol, vitamin D2, (DRISDOL) 1,250 mcg (50,000 unit) Oral Capsule Take 1 Capsule (50,000 Units total) by mouth Every 7 days   . guaiFENesin (MUCINEX) 600 mg Oral Tablet Extended Release 12hr Take 2 Tablets (1,200 mg total) by mouth   . pregabalin (LYRICA) 25 mg Oral Capsule Take 3 Capsules (75 mg total) by mouth Twice daily   . pregabalin (LYRICA) 75 mg Oral Capsule 1 Capsule (75 mg total) Twice daily   . promethazine (PHENERGAN) 25  mg Oral Tablet Take 1 Tablet (25 mg total) by mouth Every morning after breakfast   . RABEprazole (ACIPHEX) 20 mg Oral Tablet, Delayed Release (E.C.) Take 1 Tablet (20 mg total) by mouth Once a day   . traMADoL (ULTRAM) 50 mg Oral Tablet Take by mouth Three times a day as needed for Pain     Family History:  Family Medical History:     Problem Relation (Age of Onset)    Breast Cancer Sister    Dementia Father    Lung Cancer Sister    Parkinsons Disease Mother    Prostate Cancer Father          Social History:  Social History     Socioeconomic History   . Marital status: Divorced   Tobacco Use   . Smoking status: Every Day     Types: Cigarettes   . Smokeless tobacco: Never   Substance and Sexual Activity   . Alcohol use: Never   . Drug use: Never           Review of Systems:  Any pertinent Review of Systems as addressed in the HPI above.    Physical Exam:  Vital Signs:  Vitals:    12/21/21 1712   BP: (!) 145/76   Pulse: 88   Resp: 18   Temp: 37 C  (98.6 F)   TempSrc: Temporal   SpO2: 95%   Weight: 77.1 kg (170 lb)   Height: 1.575 m (5\' 2" )   BMI: 31.16     Physical Exam  Constitutional:       Appearance: Normal appearance. She is normal weight.   HENT:      Head: Normocephalic and atraumatic.      Right Ear: Tympanic membrane, ear canal and external ear normal.      Left Ear: Tympanic membrane, ear canal and external ear normal.      Nose: Nose normal.      Mouth/Throat:      Mouth: Mucous membranes are moist.      Pharynx: Oropharynx is clear.   Eyes:      Extraocular Movements: Extraocular movements intact.      Conjunctiva/sclera: Conjunctivae normal.      Pupils: Pupils are equal, round, and reactive to light.   Cardiovascular:      Rate and Rhythm: Normal rate and regular rhythm.   Pulmonary:      Effort: Pulmonary effort is normal.      Breath sounds: Normal breath sounds.   Abdominal:      General: Abdomen is flat. Bowel sounds are normal.      Palpations: Abdomen is soft.   Musculoskeletal:      Cervical back: Neck supple. Decreased range of motion.      Thoracic back: Scoliosis present.      Lumbar back: Bony tenderness: Tenderness to palpation over the patient's right greater trochanteric bursa. Negative right straight leg raise test and negative left straight leg raise test. Scoliosis present.      Comments: Decreased range of motion in side bending rotation flexion extension.   Skin:     General: Skin is warm and dry.      Capillary Refill: Capillary refill takes less than 2 seconds.   Neurological:      General: No focal deficit present.      Mental Status: She is alert and oriented to person, place, and time. Mental status is at baseline.   Psychiatric:  Mood and Affect: Mood normal.         Behavior: Behavior normal.         Thought Content: Thought content normal.         Judgment: Judgment normal.       Assessment:    ICD-10-CM    1. COPD (chronic obstructive pulmonary disease) (CMS HCC)  J44.9 CBC/DIFF      2. Tobacco abuse disorder   Z72.0       3. Gastroesophageal reflux disease without esophagitis  123456 BASIC METABOLIC PANEL      4. H/O degenerative disc disease  Z87.39       5. Thoracogenic scoliosis, thoracolumbar region  M41.35       6. Herniated nucleus pulposus, L5-S1, right  M51.27       7. Mixed hyperlipidemia  99991111 BASIC METABOLIC PANEL     URINALYSIS, MACROSCOPIC AND MICROSCOPIC W/CULTURE REFLEX     LIPID PANEL     HEPATIC FUNCTION PANEL     THYROID STIMULATING HORMONE (SENSITIVE TSH)      8. Compression of spinal cord (CMS HCC)  G95.20          Plan:  Orders Placed This Encounter   . CBC/DIFF   . BASIC METABOLIC PANEL   . URINALYSIS, MACROSCOPIC AND MICROSCOPIC W/CULTURE REFLEX   . LIPID PANEL   . HEPATIC FUNCTION PANEL   . THYROID STIMULATING HORMONE (SENSITIVE TSH)       Tobacco cessation counseling not performed due to Patient did not seem to be interested.     Today for 3 months from today I ordered a CBC with diff basic metabolic panel urinalysis microscopic and might macroscopic lipid panel hepatic function panel and thyroid stimulating hormone.  The patient was adamant that she is not going to stop smoking.  She states the Lyrica does help with her back pain she is going to follow up with Dr. Harl Bowie at [facility] but is unlikely to have surgery any time soon.  I do recommend she stay on a heart healthy low-fat low-cholesterol diet and avoid high fructose corn syrup and concentrated sweets.  She may need an injection in her right hip which I think is much less of a problem than having surgery on her back.  More than 50% of the visit was spent counseling and coordinating patient care.  Questions were answered to his satisfaction of the patient.    Return in about 3 months (around 03/23/2022).    Elliot Gault, DO     Portions of this note may be dictated using voice recognition software or a dictation service. Variances in spelling and vocabulary are possible and unintentional. Not all errors are caught/corrected. Please notify  the [facility] if any discrepancies are noted or if the meaning of any statement is not clear.

## 2021-12-22 ENCOUNTER — Other Ambulatory Visit (INDEPENDENT_AMBULATORY_CARE_PROVIDER_SITE_OTHER): Payer: Self-pay | Admitting: Family Medicine

## 2021-12-22 DIAGNOSIS — R040 Epistaxis: Secondary | ICD-10-CM

## 2021-12-22 DIAGNOSIS — J3489 Other specified disorders of nose and nasal sinuses: Secondary | ICD-10-CM

## 2021-12-23 ENCOUNTER — Other Ambulatory Visit (INDEPENDENT_AMBULATORY_CARE_PROVIDER_SITE_OTHER): Payer: Self-pay | Admitting: Family Medicine

## 2021-12-23 MED ORDER — AZITHROMYCIN 250 MG TABLET
ORAL_TABLET | ORAL | 0 refills | Status: DC
Start: 2021-12-23 — End: 2022-04-13

## 2021-12-28 ENCOUNTER — Encounter (INDEPENDENT_AMBULATORY_CARE_PROVIDER_SITE_OTHER): Payer: Self-pay | Admitting: Family Medicine

## 2022-01-13 ENCOUNTER — Other Ambulatory Visit (INDEPENDENT_AMBULATORY_CARE_PROVIDER_SITE_OTHER): Payer: Self-pay | Admitting: Family Medicine

## 2022-01-13 DIAGNOSIS — J449 Chronic obstructive pulmonary disease, unspecified: Secondary | ICD-10-CM

## 2022-01-13 DIAGNOSIS — R059 Cough, unspecified: Secondary | ICD-10-CM

## 2022-01-15 ENCOUNTER — Other Ambulatory Visit: Payer: Self-pay

## 2022-01-15 ENCOUNTER — Inpatient Hospital Stay
Admission: RE | Admit: 2022-01-15 | Discharge: 2022-01-15 | Disposition: A | Discharge status: Home / Self Care | Payer: Medicare Other | Source: Ambulatory Visit | Attending: Family Medicine | Admitting: Family Medicine

## 2022-01-15 DIAGNOSIS — J449 Chronic obstructive pulmonary disease, unspecified: Secondary | ICD-10-CM | POA: Insufficient documentation

## 2022-01-15 DIAGNOSIS — R059 Cough, unspecified: Secondary | ICD-10-CM | POA: Insufficient documentation

## 2022-02-03 ENCOUNTER — Other Ambulatory Visit (INDEPENDENT_AMBULATORY_CARE_PROVIDER_SITE_OTHER): Payer: Self-pay | Admitting: Family Medicine

## 2022-02-06 MED ORDER — TRAMADOL 50 MG TABLET
1.0000 | ORAL_TABLET | Freq: Three times a day (TID) | ORAL | 1 refills | Status: DC | PRN
Start: 2022-02-06 — End: 2022-04-13

## 2022-02-06 NOTE — Telephone Encounter (Signed)
RX approved and encounter closed

## 2022-02-21 ENCOUNTER — Ambulatory Visit (INDEPENDENT_AMBULATORY_CARE_PROVIDER_SITE_OTHER): Payer: Self-pay | Admitting: OTOLARYNGOLOGY

## 2022-02-28 ENCOUNTER — Ambulatory Visit (INDEPENDENT_AMBULATORY_CARE_PROVIDER_SITE_OTHER): Payer: Medicare Other | Admitting: OTOLARYNGOLOGY

## 2022-03-09 ENCOUNTER — Other Ambulatory Visit (INDEPENDENT_AMBULATORY_CARE_PROVIDER_SITE_OTHER): Payer: Self-pay | Admitting: Family Medicine

## 2022-03-10 MED ORDER — PREGABALIN 75 MG CAPSULE
75.0000 mg | ORAL_CAPSULE | Freq: Two times a day (BID) | ORAL | 0 refills | Status: DC
Start: 2022-03-10 — End: 2022-04-13

## 2022-03-10 NOTE — Telephone Encounter (Signed)
RX approved and encounter closed

## 2022-03-21 ENCOUNTER — Encounter (INDEPENDENT_AMBULATORY_CARE_PROVIDER_SITE_OTHER): Payer: Self-pay | Admitting: Surgery

## 2022-03-23 ENCOUNTER — Encounter (INDEPENDENT_AMBULATORY_CARE_PROVIDER_SITE_OTHER): Payer: Self-pay | Admitting: Family Medicine

## 2022-03-28 ENCOUNTER — Other Ambulatory Visit: Payer: Self-pay

## 2022-03-28 ENCOUNTER — Ambulatory Visit (INDEPENDENT_AMBULATORY_CARE_PROVIDER_SITE_OTHER): Payer: Medicare Other | Admitting: OTOLARYNGOLOGY

## 2022-03-28 ENCOUNTER — Encounter (INDEPENDENT_AMBULATORY_CARE_PROVIDER_SITE_OTHER): Payer: Self-pay | Admitting: OTOLARYNGOLOGY

## 2022-03-28 VITALS — Ht 62.0 in | Wt 170.0 lb

## 2022-03-28 DIAGNOSIS — I739 Peripheral vascular disease, unspecified: Secondary | ICD-10-CM | POA: Insufficient documentation

## 2022-03-28 DIAGNOSIS — J342 Deviated nasal septum: Secondary | ICD-10-CM

## 2022-03-28 DIAGNOSIS — R040 Epistaxis: Secondary | ICD-10-CM

## 2022-03-28 MED ORDER — MUPIROCIN 2 % TOPICAL OINTMENT
TOPICAL_OINTMENT | Freq: Three times a day (TID) | CUTANEOUS | 1 refills | Status: DC
Start: 2022-03-28 — End: 2023-03-13

## 2022-03-28 NOTE — H&P (Signed)
[facility], [facility]  [address]  [place] [address]  Phone: [phone]  Fax: [phone]      Encounter Date: 03/28/2022    Patient ID: Pamela Christensen  MRN: [id number]    DOB: 1946/04/20  Age: 76 y.o. female        Referring Provider:    Mickey Farber, DO  [address]  [place],  [place] [address]    Reason for Visit:   Chief Complaint   Patient presents with   . Epistaxis     Complains of epistaxis from right nostril for the past several years. States has gotten worse.        History of Present Illness:  Pamela Christensen is a 76 y.o. female who is referred for eval of epistaxis. Pt c/o interm R epistaxis for a few years. It bleeds approx 30 mins and stops. It bleeds from the front of her nose mostly.  Takes ASA 81 mg. Denies nasal sprays.       Patient History:  Patient Active Problem List   Diagnosis   . Thoracogenic scoliosis, thoracolumbar region   . H/O degenerative disc disease   . Anterior cervical lymphadenopathy   . Scoliosis of lumbosacral spine   . COPD (chronic obstructive pulmonary disease) (CMS HCC)   . Tobacco abuse disorder   . Herniated nucleus pulposus, L5-S1, right   . Compression of spinal cord (CMS HCC)   . 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 Gastroesophageal reflux disease   . Mixed hyperlipidemia   . Cervical radicular pain   . Degenerative scoliosis   . Peripheral vascular disease, unspecified (CMS HCC)   . Spinal stenosis of lumbar region with neurogenic claudication   . Lumbar pain     Current Outpatient Medications   Medication Sig   . aspirin 81 mg Oral Tablet, Chewable Chew 1 Tablet (81 mg total) Once a day   . atorvastatin (LIPITOR) 20 mg Oral Tablet TAKE 1 TABLET ONCE DAILY IN THE EVENING   . diclofenac sodium (VOLTAREN) 75 mg Oral Tablet, Delayed Release (E.C.) Take 1 Tablet (75 mg total) by mouth Twice daily   . ergocalciferol, vitamin D2, (DRISDOL) 1,250 mcg (50,000 unit) Oral Capsule Take 1 Capsule (50,000 Units total) by mouth Every 7 days   .  mupirocin (BACTROBAN) 2 % Ointment Apply topically Three times a day   . pregabalin (LYRICA) 75 mg Oral Capsule Take 1 Capsule (75 mg total) by mouth Twice daily   . RABEprazole (ACIPHEX) 20 mg Oral Tablet, Delayed Release (E.C.) Take 1 Tablet (20 mg total) by mouth Once a day   . traMADoL (ULTRAM) 50 mg Oral Tablet Take 1 Tablet (50 mg total) by mouth Three times a day as needed for Pain     Allergies   Allergen Reactions   . Bee Venom Protein (Honey Bee) Anaphylaxis     Past Medical History:   Diagnosis Date   . Chronic pain    . 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 Greater trochanteric bursitis of both hips    . Mixed hyperlipidemia    . Peripheral vascular disease, unspecified (CMS HCC)       Past Surgical History:   Procedure Laterality Date   . BREAST SURGERY      tumor removal   . HX APPENDECTOMY     . HX TUMOR REMOVAL      head   . SHOULDER SURGERY  tumor removal      Family Medical History:     Problem Relation (Age of Onset)    Breast Cancer Sister    Dementia Father    Lung Cancer Sister    Parkinsons Disease Mother    Prostate Cancer Father          Social History     Tobacco Use   . Smoking status: Every Day     Types: Cigarettes   . Smokeless tobacco: Never   Substance Use Topics   . Alcohol use: Never   . Drug use: Never       Review of Systems:  Review of Systems    Physical Exam:  Ht 1.575 m (5\' 2" )   Wt 77.1 kg (170 lb)   BMI 31.09 kg/m       Physical Exam  Constitutional:       Appearance: She is normal weight.   HENT:      Head: Normocephalic.      Right Ear: Hearing, ear canal and external ear normal. Tympanic membrane is scarred.      Left Ear: Hearing, ear canal and external ear normal. Tympanic membrane is scarred.      Mouth/Throat:      Mouth: Mucous membranes are moist.         Assessment:  ENCOUNTER DIAGNOSES     ICD-10-CM   1. DNS (deviated nasal septum)  J34.2   2. Epistaxis  R04.0       Plan:  Medical records reviewed on 03/28/2022.  Patient refuses nasal endo with cautery  at this time.     Orders Placed This Encounter   . CANCELED: 03/30/2022 - NASAL/SINUS ENDOSCOPY W/ CONTROL OF NASAL HEMORRHAGE (AMB ONLY)   . mupirocin (BACTROBAN) 2 % Ointment     Return in about 4 weeks (around 04/25/2022).    06/26/2022, PA-C  03/28/2022, 13:52   The advanced practice clinician's documentation was reviewed/amended in its entirety with the assessment and plan portion completely performed independently by me during this separate encounter.

## 2022-04-04 ENCOUNTER — Encounter (INDEPENDENT_AMBULATORY_CARE_PROVIDER_SITE_OTHER): Payer: Self-pay | Admitting: Surgery

## 2022-04-04 ENCOUNTER — Ambulatory Visit (INDEPENDENT_AMBULATORY_CARE_PROVIDER_SITE_OTHER): Payer: Medicare Other | Admitting: Surgery

## 2022-04-04 ENCOUNTER — Other Ambulatory Visit: Payer: Self-pay

## 2022-04-04 VITALS — BP 163/74 | HR 48 | Temp 97.2°F | Resp 18 | Ht 62.0 in | Wt 167.0 lb

## 2022-04-04 DIAGNOSIS — I739 Peripheral vascular disease, unspecified: Secondary | ICD-10-CM

## 2022-04-04 DIAGNOSIS — R590 Localized enlarged lymph nodes: Secondary | ICD-10-CM

## 2022-04-04 NOTE — Progress Notes (Signed)
[facility]  GENERAL SURGERY, [facility] GENERAL SURGERY    Progress Note    Name: Pamela Christensen MRN:  [id number]   Date: 04/04/2022 Age: 76 y.o.            Date of Service:  04/04/2022  Pamela Christensen, Pamela Christensen, 76 y.o. female  Date of Birth:  19-Aug-1946  PCP: Mickey Farber, DO  Referring:  No ref. provider found     HPI:  Pamela Christensen is a 76 y.o. White female who returns for follow-up regarding her left cervical lymph node examination.  The patient states that she is done well since last seen.  She denies any problems or complaints.  She denies any neck pain, dysphagia or earache.  She feels that the lymph node has not gotten any larger.          Past Medical History:   Diagnosis Date   . Chronic pain    . 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 Greater trochanteric bursitis of both hips    . Mixed hyperlipidemia    . Peripheral vascular disease, unspecified (CMS HCC)       Past Surgical History:   Procedure Laterality Date   . BREAST SURGERY      tumor removal   . HX APPENDECTOMY     . HX TUMOR REMOVAL      head   . SHOULDER SURGERY      tumor removal      No outpatient medications have been marked as taking for the 04/04/22 encounter (Office Visit) with Lytle Malburg, Cherylann Parr, MD.      Allergies   Allergen Reactions   . Bee Venom Protein (Honey Bee) Anaphylaxis           BP (!) 163/74   Pulse 48   Temp 36.2 C (97.2 F)   Resp 18   Ht 1.575 m (5\' 2" )   Wt 75.8 kg (167 lb)   SpO2 96%   BMI 30.54 kg/m          General: appropriate for age. in no acute distress.    Vital signs are present above and have been reviewed by me     HEENT: Atraumatic, Normocephalic.    Neck:  Examination of the cervical area in the left side shows a slightly prominent left submandibular gland but no other cervical lymph nodes noted.  This area is nontender and it was not fixed or hard.    Lungs: Nonlabored breathing with symmetric expansion    Heart:Regular wth respect to rate and rythmn.    Abdomen:Soft. Nontender. Nondistended and  benign    Psychiatric: Alert and oriented to person, place, and time. affect appropriate       Assessment/Plan:  Assessment/Plan   1. Peripheral vascular disease, unspecified (CMS HCC)    2. LAD (lymphadenopathy) of left cervical region         Continue self-examination on regular basis and call if she has any questions or concerns or if she notices that things have changed.  Otherwise I will see her back in clinic on p.r.n. basis.  The patient was very appreciative of the care given.    Return if symptoms worsen or fail to improve.     This note was partially created using voice recognition software and is inherently subject to errors including those of syntax and "sound alike " substitutions which may escape proof reading. In such instances, original meaning may be extrapolated by  contextual derivation.    Domenique Southers B Katena Petitjean, MD,MBA,FACS

## 2022-04-13 ENCOUNTER — Other Ambulatory Visit: Payer: Self-pay

## 2022-04-13 ENCOUNTER — Ambulatory Visit (INDEPENDENT_AMBULATORY_CARE_PROVIDER_SITE_OTHER): Payer: Medicare Other | Admitting: Family Medicine

## 2022-04-13 ENCOUNTER — Encounter (INDEPENDENT_AMBULATORY_CARE_PROVIDER_SITE_OTHER): Payer: Self-pay | Admitting: Family Medicine

## 2022-04-13 VITALS — BP 126/69 | HR 77 | Temp 98.6°F | Resp 18 | Ht 62.0 in | Wt 164.0 lb

## 2022-04-13 DIAGNOSIS — I739 Peripheral vascular disease, unspecified: Secondary | ICD-10-CM

## 2022-04-13 DIAGNOSIS — M549 Dorsalgia, unspecified: Secondary | ICD-10-CM

## 2022-04-13 DIAGNOSIS — E782 Mixed hyperlipidemia: Secondary | ICD-10-CM

## 2022-04-13 DIAGNOSIS — J449 Chronic obstructive pulmonary disease, unspecified: Secondary | ICD-10-CM

## 2022-04-13 DIAGNOSIS — M25551 Pain in right hip: Secondary | ICD-10-CM

## 2022-04-13 DIAGNOSIS — M7061 Trochanteric bursitis, right hip: Secondary | ICD-10-CM

## 2022-04-13 MED ORDER — TRAMADOL 50 MG TABLET
1.0000 | ORAL_TABLET | Freq: Three times a day (TID) | ORAL | 1 refills | Status: AC | PRN
Start: 2022-04-13 — End: ?

## 2022-04-13 MED ORDER — PREGABALIN 75 MG CAPSULE
75.0000 mg | ORAL_CAPSULE | Freq: Two times a day (BID) | ORAL | 0 refills | Status: AC
Start: 2022-04-13 — End: ?

## 2022-04-13 MED ORDER — ATORVASTATIN 20 MG TABLET
ORAL_TABLET | ORAL | 1 refills | Status: AC
Start: 2022-04-13 — End: ?

## 2022-04-13 NOTE — Nursing Note (Signed)
04/13/22 1642   Fall Risk Assessment   Do you feel unsteady when standing or walking? No   Do you worry about falling? No   Have you fallen in the past year? No

## 2022-04-13 NOTE — Progress Notes (Signed)
FAMILY MEDICINE, MEDICAL OFFICE BUILDING  [address]  [place] [address]       Name: Pamela Christensen MRN:  [id number]   Date: 04/13/2022 Age: 76 y.o.          Provider: Mickey Farber, DO    Reason for visit: Follow Up 3 Months      History of Present Illness:  04/13/2022:  This 76 year old female returns for 3 month follow-up to review her labs and get refills on all of her medications unfortunately she did have her labs done.  She wants a refill on tramadol and her Lyrica however I told her she needs to sign a pain contract 1st.  She refused I told her I could not medications unless she signed the pain contract.  She looked good the pain contract and declined to sign it.  I will speak to her pharmacist to see if he can convince her that is okay sign.  Today she wants referral to Dr. Lequita Halt for right leg pain but is actually right greater trochanteric bursitis.  She says she is had injections there before she was seen by Dr. D because of a lymphadenopathy in the neck he said he did think there was anything to worry about.  I reviewed with her in detail the results of his visit.  The x-ray of her kidneys ureters and bladder because of dull costovertebral angle panel back pain at [facility] which we sent to her.  Historical Data    Past Medical History:  Past Medical History:   Diagnosis Date   . Chronic pain    . 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 Greater trochanteric bursitis of both hips    . Mixed hyperlipidemia    . Peripheral vascular disease, unspecified (CMS HCC)          Past Surgical History:  Past Surgical History:   Procedure Laterality Date   . BREAST SURGERY      tumor removal   . HX APPENDECTOMY     . HX TUMOR REMOVAL      head   . SHOULDER SURGERY      tumor removal         Allergies:  Allergies   Allergen Reactions   . Bee Venom Protein (Honey Bee) Anaphylaxis     Medications:  Current Outpatient Medications   Medication Sig   . aspirin 81 mg Oral Tablet, Chewable Chew 1  Tablet (81 mg total) Once a day   . atorvastatin (LIPITOR) 20 mg Oral Tablet TAKE 1 TABLET ONCE DAILY IN THE EVENING   . diclofenac sodium (VOLTAREN) 75 mg Oral Tablet, Delayed Release (E.C.) Take 1 Tablet (75 mg total) by mouth Twice daily   . ergocalciferol, vitamin D2, (DRISDOL) 1,250 mcg (50,000 unit) Oral Capsule Take 1 Capsule (50,000 Units total) by mouth Every 7 days   . mupirocin (BACTROBAN) 2 % Ointment Apply topically Three times a day   . pregabalin (LYRICA) 75 mg Oral Capsule Take 1 Capsule (75 mg total) by mouth Twice daily   . RABEprazole (ACIPHEX) 20 mg Oral Tablet, Delayed Release (E.C.) Take 1 Tablet (20 mg total) by mouth Once a day   . traMADoL (ULTRAM) 50 mg Oral Tablet Take 1 Tablet (50 mg total) by mouth Three times a day as needed for Pain     Family History:  Family Medical History:     Problem Relation (Age of Onset)    Breast Cancer Sister  Dementia Father    Lung Cancer Sister    Parkinsons Disease Mother    Prostate Cancer Father          Social History:  Social History     Socioeconomic History   . Marital status: Divorced   Tobacco Use   . Smoking status: Every Day     Packs/day: 1.00     Years: 40.00     Pack years: 40.00     Types: Cigarettes   . Smokeless tobacco: Never   Vaping Use   . Vaping Use: Never used   Substance and Sexual Activity   . Alcohol use: Never   . Drug use: Never           Review of Systems:  Any pertinent Review of Systems as addressed in the HPI above.    Physical Exam:  Vital Signs:  Vitals:    04/13/22 1643   BP: 126/69   Pulse: 77   Resp: 18   Temp: 37 C (98.6 F)   TempSrc: Temporal   SpO2: 93%   Weight: 74.4 kg (164 lb)   Height: 1.575 m (5\' 2" )   BMI: 30.06     Physical Exam  Constitutional:       Appearance: Normal appearance. She is normal weight.   HENT:      Head: Normocephalic and atraumatic.      Right Ear: Tympanic membrane, ear canal and external ear normal.      Left Ear: Tympanic membrane, ear canal and external ear normal.      Nose: Nose  normal.      Mouth/Throat:      Mouth: Mucous membranes are moist.      Pharynx: Oropharynx is clear.   Eyes:      Extraocular Movements: Extraocular movements intact.      Conjunctiva/sclera: Conjunctivae normal.      Pupils: Pupils are equal, round, and reactive to light.   Cardiovascular:      Rate and Rhythm: Normal rate and regular rhythm.   Pulmonary:      Effort: Pulmonary effort is normal.      Breath sounds: Normal breath sounds.   Abdominal:      General: Abdomen is protuberant. Bowel sounds are normal.      Palpations: Abdomen is soft.      Tenderness: There is right CVA tenderness.   Musculoskeletal:         General: Normal range of motion.      Cervical back: Normal range of motion and neck supple.      Right hip: Tenderness and bony tenderness present.      Comments: Pain over the right greater trochanteric bursa   Skin:     General: Skin is warm and dry.      Capillary Refill: Capillary refill takes less than 2 seconds.   Neurological:      General: No focal deficit present.      Mental Status: She is alert and oriented to person, place, and time. Mental status is at baseline.   Psychiatric:         Mood and Affect: Mood normal.         Behavior: Behavior normal.         Thought Content: Thought content normal.         Judgment: Judgment normal.       Assessment:    ICD-10-CM    1. Peripheral vascular disease, unspecified (CMS HCC)  I73.9  2. COPD (chronic obstructive pulmonary disease) (CMS HCC)  J44.9       3. Mixed hyperlipidemia  E78.2       4. Right hip pain  M25.551 Referral to External Provider     CANCELED: Referral to External Provider      5. Greater trochanteric bursitis of right hip  M70.61       6. Costovertebral angle tenderness  M54.9 XR KUB AND UPRIGHT ABDOMEN     CANCELED: XR KUB AND UPRIGHT ABDOMEN         Plan:  Orders Placed This Encounter   . CANCELED: XR KUB AND UPRIGHT ABDOMEN   . XR KUB AND UPRIGHT ABDOMEN   . CANCELED: Referral to External Provider   . Referral to  External Provider   . atorvastatin (LIPITOR) 20 mg Oral Tablet   . pregabalin (LYRICA) 75 mg Oral Capsule   . traMADoL (ULTRAM) 50 mg Oral Tablet     I am going to send the patient to  Dr. Lequita Halt to evaluate her greater trochanteric bursitis.  Today I refilled her Lipitor and then had to cancel her pregabalin and tramadol because she refused to sign the mandated pain contract.  I called the pharmacist and he said he will talk to her about.  She already has labs ordered stat order to get those as quick as she can.  More than 50% of the visit was spent counseling and coordinating patient care.  All questions were answered to his satisfaction of the patient.  If the patient continues to refuse to sign a pain contract she will need to find another physician to provide his controlled substances 2.  This was explained in detail to the patient as she read the pain contract.  More than 50% of the visit was spent counseling coordinating patient care.  All questions were answered to his satisfaction of the patient.    No follow-ups on file.    Mickey Farber, DO     Portions of this note may be dictated using voice recognition software or a dictation service. Variances in spelling and vocabulary are possible and unintentional. Not all errors are caught/corrected. Please notify the Thereasa Parkin if any discrepancies are noted or if the meaning of any statement is not clear.

## 2022-04-13 NOTE — Nursing Note (Signed)
04/13/22 1642   PHQ 9 (follow up)   Little interest or pleasure in doing things. 0   Feeling down, depressed, or hopeless 0

## 2022-04-27 IMAGING — DX ABDOMEN XRAY 2 VIEWS
1 series · 3 of 3 positions shown · non-contrast
Comparison: None available.

﻿EXAM:  [id number]   ABDOMEN XRAY 2 VIEWS
INDICATION: History of kidney stones.

[Series 1: apsupine · 0.14mm/px · 3 of 3 slices shown]
[im 1/3]
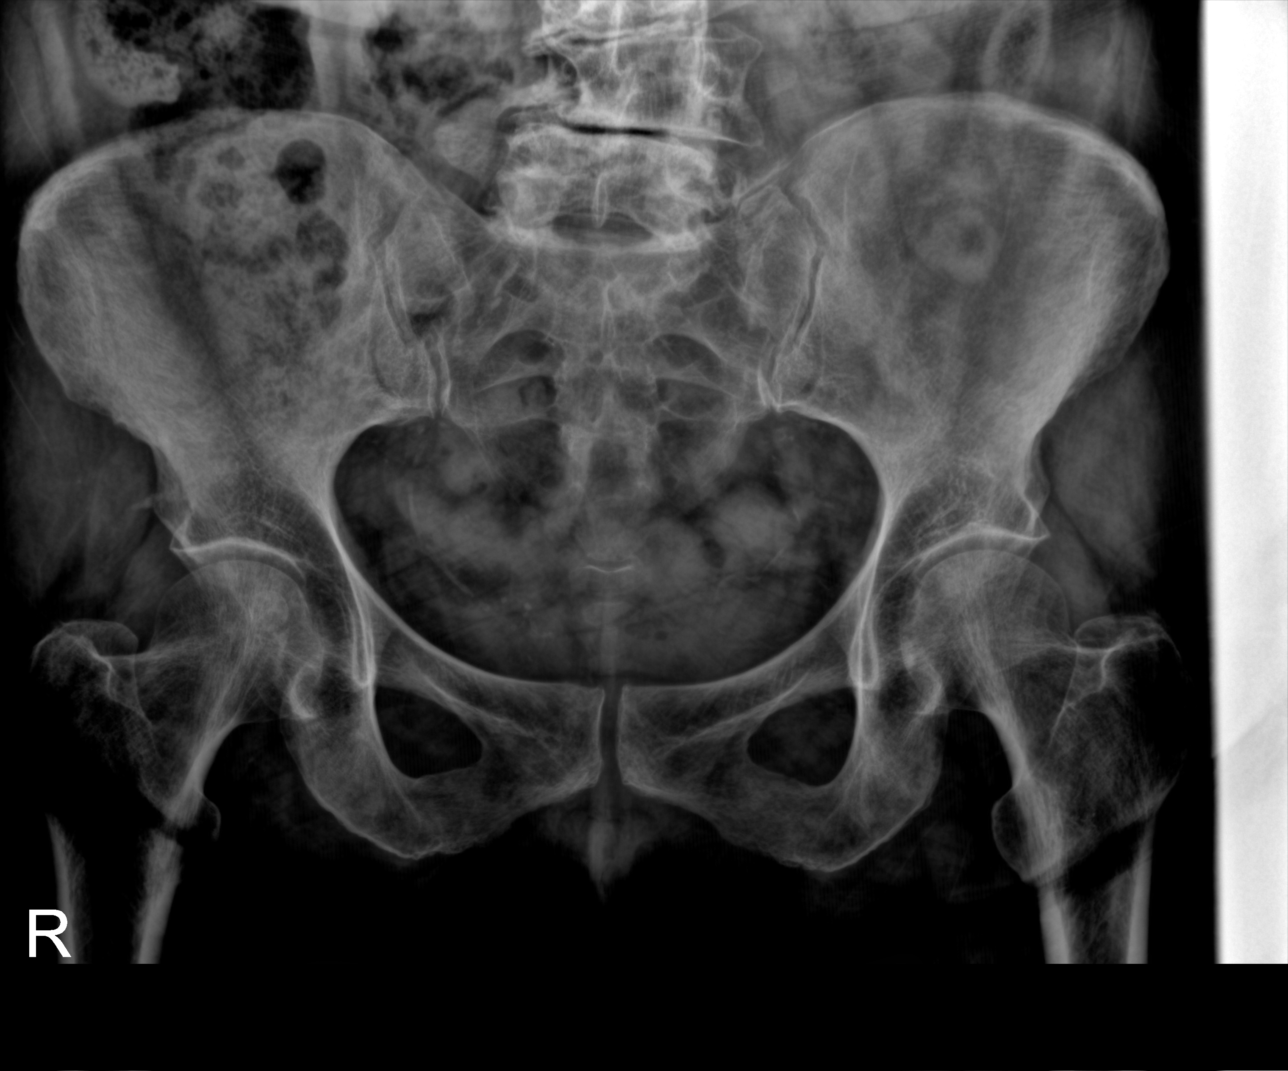
[im 2/3]
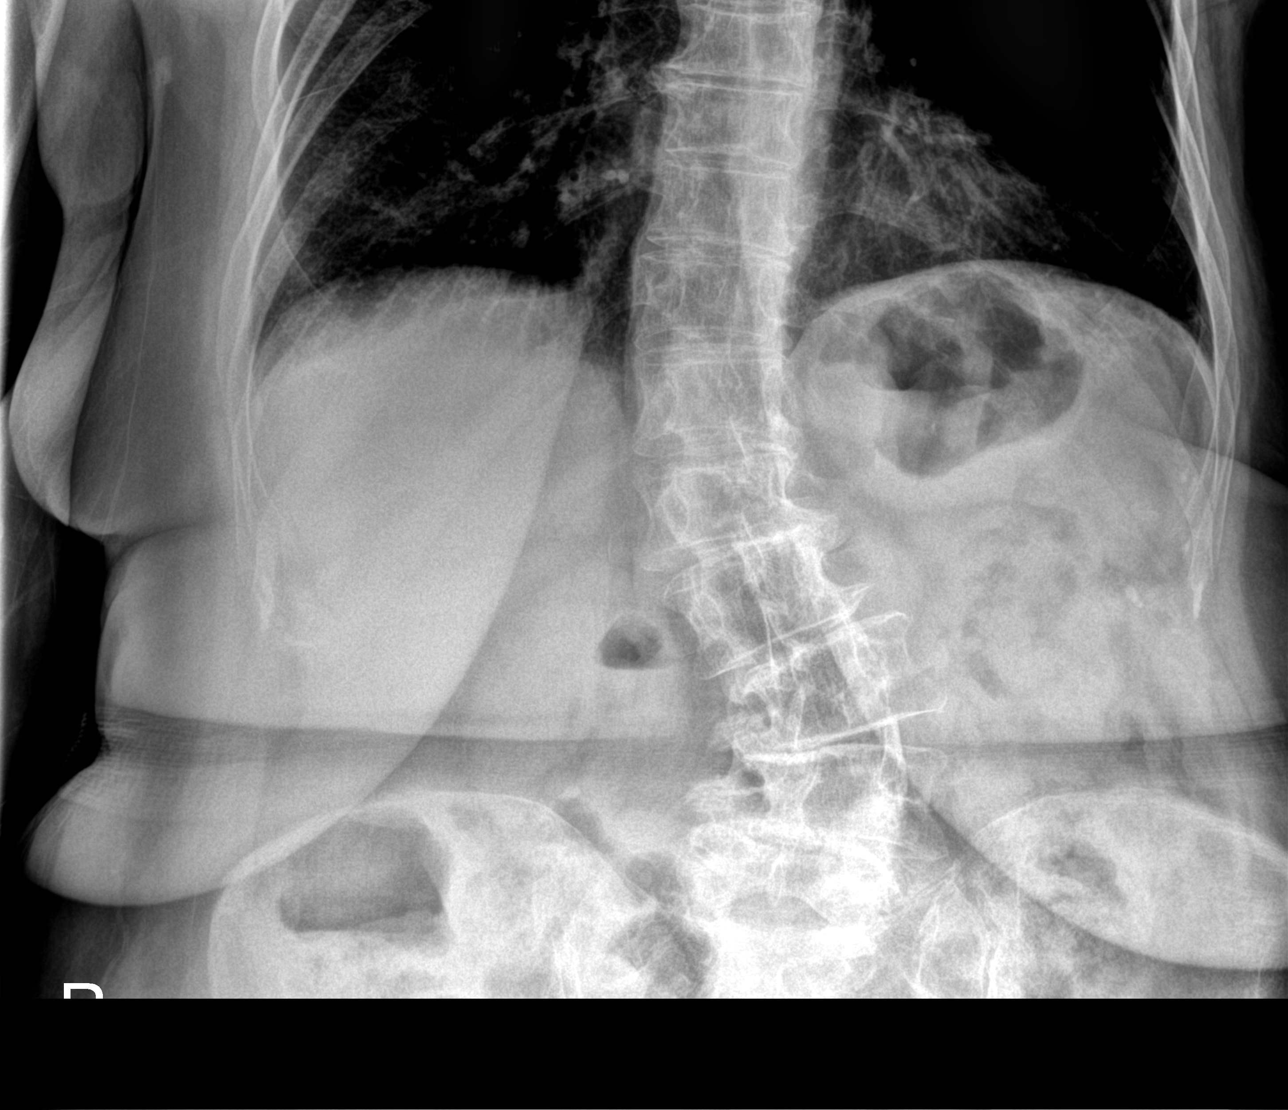
[im 3/3]
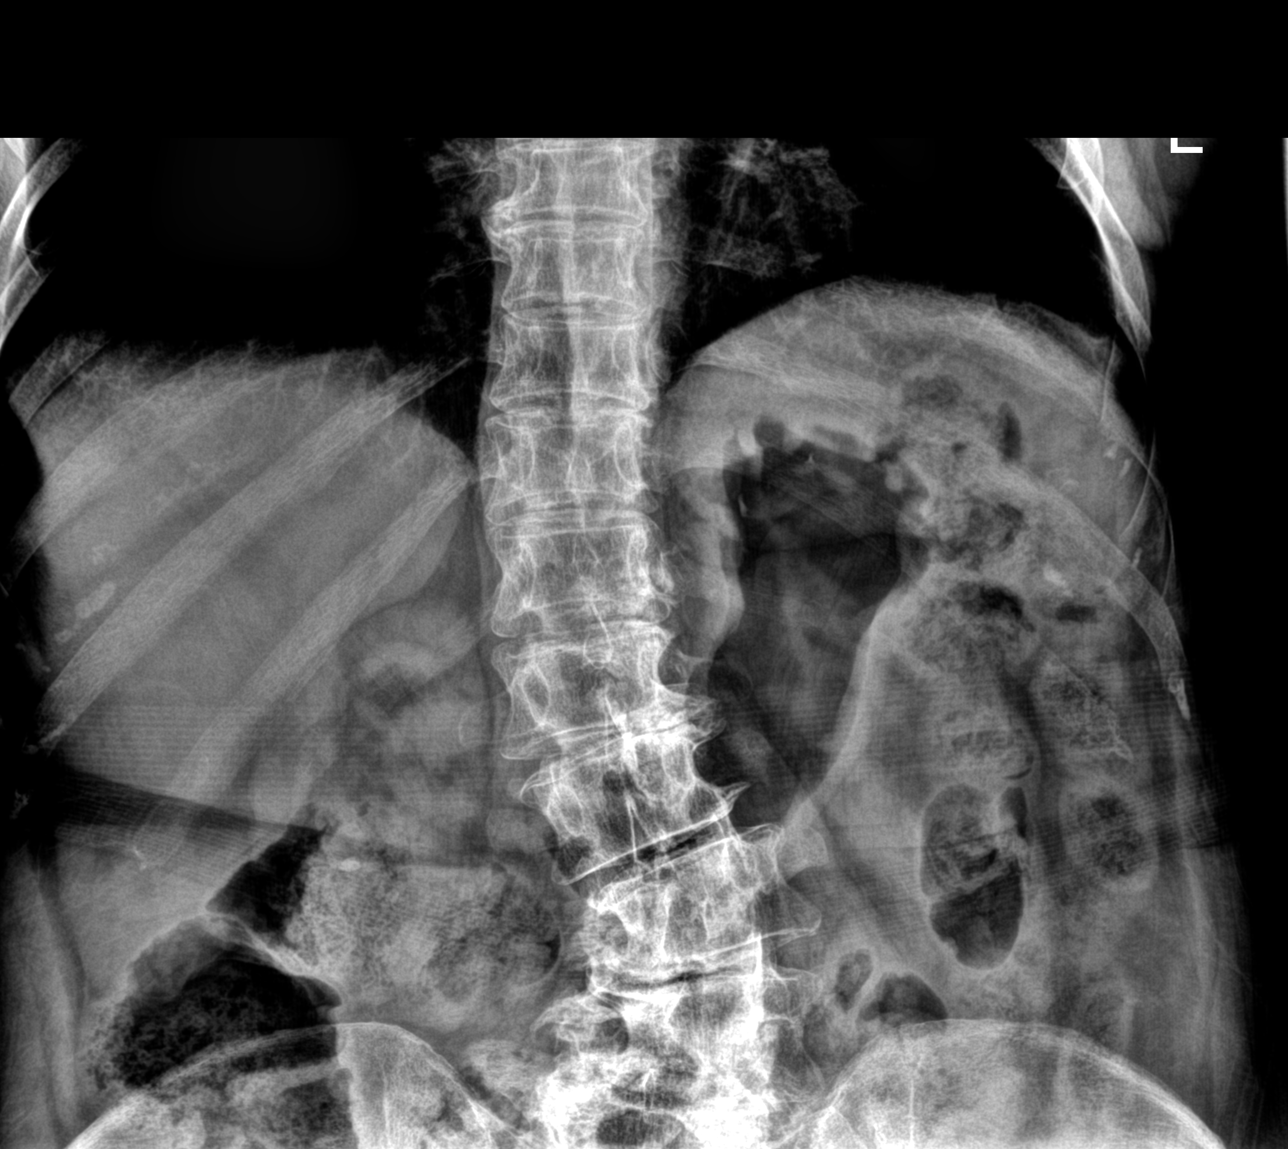

[3 of 3 positions shown; findings below may reference images not displayed]

FINDINGS: Bowel gas pattern is nonobstructive but nonspecific.  No abnormal calcifications are seen.  There is no intra-abdominal free air on the upright view. Lung bases are clear.  There is an S shaped scoliosis of the visualized thoracolumbar spine. Advanced multilevel arthritic changes of the lumbar spine are also noted.
IMPRESSION: As above.

## 2022-05-18 ENCOUNTER — Encounter (INDEPENDENT_AMBULATORY_CARE_PROVIDER_SITE_OTHER): Payer: Self-pay | Admitting: OTOLARYNGOLOGY

## 2022-07-27 ENCOUNTER — Encounter (INDEPENDENT_AMBULATORY_CARE_PROVIDER_SITE_OTHER): Payer: Self-pay | Admitting: Family Medicine

## 2022-09-05 ENCOUNTER — Other Ambulatory Visit: Payer: Self-pay

## 2022-09-05 ENCOUNTER — Encounter (INDEPENDENT_AMBULATORY_CARE_PROVIDER_SITE_OTHER): Payer: Self-pay | Admitting: Surgery

## 2022-09-05 ENCOUNTER — Ambulatory Visit (INDEPENDENT_AMBULATORY_CARE_PROVIDER_SITE_OTHER): Payer: Medicare Other | Admitting: Surgery

## 2022-09-05 VITALS — BP 170/92 | HR 87 | Temp 96.9°F | Ht 62.0 in | Wt 166.2 lb

## 2022-09-05 DIAGNOSIS — R109 Unspecified abdominal pain: Secondary | ICD-10-CM

## 2022-09-05 NOTE — Nursing Note (Signed)
Can you please sign her note from today so i can fax clinicals to her insurance company. Thank you. Shelby Mattocks, LPN  02/63/7858 85:02

## 2022-09-05 NOTE — Progress Notes (Signed)
[facility]  GENERAL SURGERY, [facility]    Progress Note    Name: Pamela Christensen MRN:  [id number]   Date: 09/05/2022 Age: 76 y.o.            Date of Service:  09/05/2022  Pamela Christensen, 76 y.o. female  Date of Birth:  May 31, 1946  PCP: No Pcp  Referring:  No ref. provider found     HPI:  Pamela Christensen is a 77 y.o. White female who returns for evaluation because of right-sided abdominal discomfort since July.  She noticed this every day.  She denies any constipation, nausea or vomiting.  She thought she may have had kidney stones.          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     Greater trochanteric bursitis of both hips     Mixed hyperlipidemia     Peripheral vascular disease, unspecified (CMS HCC)       Past Surgical History:   Procedure Laterality Date    BREAST SURGERY      tumor removal    HX APPENDECTOMY      HX TUMOR REMOVAL      head    SHOULDER SURGERY      tumor removal      Outpatient Medications Marked as Taking for the 09/05/22 encounter (Office Visit) with Symia Herdt, Cherylann Parr, MD   Medication Sig    aspirin 81 mg Oral Tablet, Chewable Chew 1 Tablet (81 mg total) Once a day    atorvastatin (LIPITOR) 20 mg Oral Tablet TAKE 1 TABLET ONCE DAILY IN THE EVENING    diclofenac sodium (VOLTAREN) 75 mg Oral Tablet, Delayed Release (E.C.) Take 1 Tablet (75 mg total) by mouth Twice daily    ergocalciferol, vitamin D2, (DRISDOL) 1,250 mcg (50,000 unit) Oral Capsule Take 1 Capsule (50,000 Units total) by mouth Every 7 days    pregabalin (LYRICA) 75 mg Oral Capsule Take 1 Capsule (75 mg total) by mouth Twice daily    RABEprazole (ACIPHEX) 20 mg Oral Tablet, Delayed Release (E.C.) Take 1 Tablet (20 mg total) by mouth Once a day    traMADoL (ULTRAM) 50 mg Oral Tablet Take 1 Tablet (50 mg total) by mouth Three times a day as needed for Pain      Allergies   Allergen Reactions    Bee Venom Protein (Honey Bee) Anaphylaxis           BP (!) 170/92  (Site: Left, Patient Position: Sitting)   Pulse 87   Temp 36.1 C (96.9 F)   Ht 1.575 m (5\' 2" )   Wt 75.4 kg (166 lb 3.2 oz)   SpO2 98%   BMI 30.40 kg/m          General: appropriate for age. in no acute distress.    Vital signs are present above and have been reviewed by me     HEENT: Atraumatic, Normocephalic.    Lungs: Nonlabored breathing with symmetric expansion    Heart:Regular wth respect to rate and rythmn.    Abdomen:Soft.  Minimal discomfort to deep palpation along the right side of the abdomen with no rebound guarding or peritoneal signs. Nondistended and benign.    Psychiatric: Alert and oriented to person, place, and time. affect appropriate       Assessment/Plan:  Assessment/Plan   1. Right sided abdominal pain  CT scan of the abdomen and pelvis with contrast to be performed.  Based on these findings we will then determine if anything further needs to be done.  The patient understood and agreed with that plan.    No follow-ups on file.     This note was partially created using voice recognition software and is inherently subject to errors including those of syntax and "sound alike " substitutions which may escape proof reading. In such instances, original meaning may be extrapolated by contextual derivation.    [name] [name], MD,MBA,FACS

## 2022-09-18 ENCOUNTER — Encounter (HOSPITAL_COMMUNITY): Payer: Self-pay | Admitting: Physician Assistant

## 2022-09-18 ENCOUNTER — Emergency Department
Admission: EM | Admit: 2022-09-18 | Discharge: 2022-09-18 | Disposition: A | Discharge status: Home / Self Care | Payer: Medicare Other | Attending: Physician Assistant | Admitting: Physician Assistant

## 2022-09-18 ENCOUNTER — Other Ambulatory Visit: Payer: Self-pay

## 2022-09-18 ENCOUNTER — Emergency Department (HOSPITAL_COMMUNITY): Payer: Medicare Other

## 2022-09-18 DIAGNOSIS — G8929 Other chronic pain: Secondary | ICD-10-CM | POA: Insufficient documentation

## 2022-09-18 DIAGNOSIS — R1011 Right upper quadrant pain: Secondary | ICD-10-CM | POA: Insufficient documentation

## 2022-09-18 DIAGNOSIS — R109 Unspecified abdominal pain: Secondary | ICD-10-CM

## 2022-09-18 LAB — URINALYSIS, MICROSCOPIC
RBCS: 1 /hpf (ref <4)
SQUAMOUS EPITHELIAL: 1 /hpf (ref <28)
WBCS: 3 /hpf (ref <6)

## 2022-09-18 LAB — URINALYSIS, MACROSCOPIC
BILIRUBIN: NEGATIVE mg/dL
BLOOD: NEGATIVE mg/dL
GLUCOSE: NEGATIVE mg/dL
KETONES: NEGATIVE mg/dL
LEUKOCYTES: 25 WBCs/uL — AB
NITRITE: NEGATIVE
PH: 5.5 (ref 5.0–9.0)
PROTEIN: NEGATIVE mg/dL
SPECIFIC GRAVITY: 1.009 (ref 1.002–1.030)
UROBILINOGEN: NORMAL mg/dL

## 2022-09-18 LAB — CBC WITH DIFF
BASOPHIL #: 0.1 10*3/uL (ref 0.00–0.10)
BASOPHIL %: 1 % (ref 0–1)
EOSINOPHIL #: 0.2 10*3/uL (ref 0.00–0.50)
EOSINOPHIL %: 2 %
HCT: 42 % — ABNORMAL HIGH (ref 31.2–41.9)
HGB: 14.2 g/dL (ref 10.9–14.3)
LYMPHOCYTE #: 3.1 10*3/uL — ABNORMAL HIGH (ref 1.00–3.00)
LYMPHOCYTE %: 28 % (ref 16–44)
MCH: 30.7 pg (ref 24.7–32.8)
MCHC: 33.9 g/dL (ref 32.3–35.6)
MCV: 90.4 fL (ref 75.5–95.3)
MONOCYTE #: 0.9 10*3/uL (ref 0.30–1.00)
MONOCYTE %: 8 % (ref 5–13)
MPV: 10.6 fL (ref 7.9–10.8)
NEUTROPHIL #: 6.8 10*3/uL (ref 1.85–7.80)
NEUTROPHIL %: 61 % (ref 43–77)
PLATELET COMMENT: NORMAL
PLATELETS: 221 10*3/uL (ref 140–440)
RBC COMMENT: NORMAL
RBC: 4.64 10*6/uL (ref 3.63–4.92)
RDW: 15.4 % (ref 12.3–17.7)
WBC: 11.1 10*3/uL (ref 3.8–11.8)

## 2022-09-18 LAB — COMPREHENSIVE METABOLIC PANEL, NON-FASTING
ALBUMIN/GLOBULIN RATIO: 1.5 — ABNORMAL HIGH (ref 0.8–1.4)
ALBUMIN: 4.3 g/dL (ref 3.5–5.7)
ALKALINE PHOSPHATASE: 86 U/L (ref 34–104)
ALT (SGPT): 18 U/L (ref 7–52)
ANION GAP: 5 mmol/L (ref 4–13)
AST (SGOT): 15 U/L (ref 13–39)
BILIRUBIN TOTAL: 0.4 mg/dL (ref 0.3–1.2)
BUN/CREA RATIO: 15 (ref 6–22)
BUN: 14 mg/dL (ref 7–25)
CALCIUM, CORRECTED: 9.7 mg/dL (ref 8.9–10.8)
CALCIUM: 9.9 mg/dL (ref 8.6–10.3)
CHLORIDE: 105 mmol/L (ref 98–107)
CO2 TOTAL: 28 mmol/L (ref 21–31)
CREATININE: 0.94 mg/dL (ref 0.60–1.30)
ESTIMATED GFR: 64 mL/min/{1.73_m2} (ref >59)
GLOBULIN: 2.8 — ABNORMAL LOW (ref 2.9–5.4)
GLUCOSE: 104 mg/dL (ref 74–109)
OSMOLALITY, CALCULATED: 277 mOsm/kg (ref 270–290)
POTASSIUM: 3.9 mmol/L (ref 3.5–5.1)
PROTEIN TOTAL: 7.1 g/dL (ref 6.4–8.9)
SODIUM: 138 mmol/L (ref 136–145)

## 2022-09-18 LAB — LIPASE: LIPASE: 51 U/L (ref 11–82)

## 2022-09-18 LAB — LACTIC ACID LEVEL W/ REFLEX FOR LEVEL >2.0: LACTIC ACID: 1.2 mmol/L (ref 0.5–2.2)

## 2022-09-18 NOTE — ED Nurses Note (Signed)
D/C instructions reviewed with patient.  She verbalized understanding.  Pt left for home, ambulatory.

## 2022-09-18 NOTE — ED Provider Notes (Signed)
[facility]  ED Primary Provider Note  History of Present Illness   Chief Complaint   Patient presents with    Back Pain    Nausea     Pamela Christensen is a 76 y.o. female who had concerns including Back Pain and Nausea.  Arrival: The patient arrived by Car    Patient is a 76 year old female past will history of spinal stenosis presents emergency department with complaints of right flank pain times 2 months.  She states it is intermittent.  She can not identify any alleviating or exacerbating factors.  States it happens randomly it lasts for random amount of time.  She is unsure whether food makes it better or worse she is been following with Dr.Duremedes who ordered a CT outpatient evaluate for gallstones.      History Reviewed This Encounter: Medical History  Surgical History  Family History  Social History    Physical Exam   ED Triage Vitals [09/18/22 1817]   BP (Non-Invasive) (!) 169/85   Heart Rate 92   Respiratory Rate 17   Temperature (!) 35.8 C (96.4 F)   SpO2 97 %   Weight 74.8 kg (165 lb)   Height 1.575 m (_0 )     Physical Exam  Constitutional:       Appearance: Normal appearance.   HENT:      Head: Normocephalic.      Nose: Nose normal.      Mouth/Throat:      Mouth: Mucous membranes are moist.   Eyes:      Extraocular Movements: Extraocular movements intact.      Pupils: Pupils are equal, round, and reactive to light.   Cardiovascular:      Rate and Rhythm: Normal rate and regular rhythm.      Pulses: Normal pulses.      Heart sounds: Normal heart sounds.   Pulmonary:      Effort: Pulmonary effort is normal.      Breath sounds: Normal breath sounds.   Abdominal:      General: Abdomen is flat.      Palpations: Abdomen is soft.      Tenderness: There is no right CVA tenderness or left CVA tenderness.   Musculoskeletal:      Cervical back: Normal range of motion.   Neurological:      Mental Status: She is alert.       Patient Data     Labs Ordered/Reviewed    COMPREHENSIVE METABOLIC PANEL, NON-FASTING - Abnormal; Notable for the following components:       Result Value    ALBUMIN/GLOBULIN RATIO 1.5 (*)     GLOBULIN 2.8 (*)     All other components within normal limits    Narrative:     Estimated Glomerular Filtration Rate (eGFR) is calculated using the CKD-EPI (2021) equation, intended for patients 25 years of age and older. If gender is not documented or "unknown", there will be no eGFR calculation.   CBC WITH DIFF - Abnormal; Notable for the following components:    HCT 42.0 (*)     LYMPHOCYTE # 3.10 (*)     All other components within normal limits   URINALYSIS, MACROSCOPIC - Abnormal; Notable for the following components:    LEUKOCYTES 25 (*)     All other components within normal limits   URINALYSIS, MICROSCOPIC - Abnormal; Notable for the following components:    MUCOUS Rare (*)     All  other components within normal limits   LACTIC ACID LEVEL W/ REFLEX FOR LEVEL >2.0 - Normal   LIPASE - Normal   ADULT ROUTINE BLOOD CULTURE, SET OF 2 BOTTLES (BACTERIA AND YEAST)   ADULT ROUTINE BLOOD CULTURE, SET OF 2 BOTTLES (BACTERIA AND YEAST)   CBC/DIFF    Narrative:     The following orders were created for panel order CBC/DIFF.  Procedure                               Abnormality         Status                     ---------                               -----------         ------                     CBC WITH DIFF[[id number]]                Abnormal            Final result                 Please view results for these tests on the individual orders.   URINALYSIS, MACROSCOPIC AND MICROSCOPIC W/CULTURE REFLEX    Narrative:     The following orders were created for panel order URINALYSIS, MACROSCOPIC AND MICROSCOPIC W/CULTURE REFLEX.  Procedure                               Abnormality         Status                     ---------                               -----------         ------                     URINALYSIS, MACROSCOPIC[[id number]]      Abnormal            Final result                URINALYSIS, MICROSCOPIC[[id number]]      Abnormal            Final result                 Please view results for these tests on the individual orders.     CT ABDOMEN PELVIS WO IV CONTRAST   Final Result by Edi, Radresults In (12/04 2038)   1. NO ACUTE FINDINGS AT THE ABDOMEN OR PELVIS ON NONCONTRAST CT.          One or more dose reduction techniques were used (e.g., Automated exposure control, adjustment of the mA and/or kV according to patient size, use of iterative reconstruction technique).         Radiologist location ID: [place] [facility]        Medical Decision Making  Patient's blood work and CT scan largely unremarkable for  acute pathology.  Patient was given copy of labs and CT scan she is follow up with her primary care and general surgeon                Clinical Impression   Rt flank pain (Primary)       Disposition: Discharged

## 2022-09-18 NOTE — ED Triage Notes (Addendum)
States pain in right side of back for 2 months. States her PCP has seen her and ordered a ct and is scheduled dec 20th. Denies pain at this time. States pain comes and goes.

## 2022-09-18 NOTE — ED APP Handoff Note (Signed)
[facility]  Emergency Department  Provider in Triage Note    Name: Pamela Christensen  Age: 76 y.o.  Gender: female     Subjective:   Pamela Christensen is a 76 y.o. female who presents with complaint of No chief complaint on file.  .  Right flank pain x a few months. Pain is intermittent and isn't associated with any activity or food. She has aggravating factors. Pain does improve with her chronic pain meds.  Pain worsened yesterday. Associated nausea, chills, occasional RUQ pain. Denies dysuria, trauma, rash, dysuria, hematuria, vomiting, diarrhea.     Objective:   There were no vitals filed for this visit.   Focused Physical Exam shows elderly female in NAD. No rash to back. No CVA tenderness. No RUQ tenderness (exam completed in a sitting position)    Assessment:  A medical screening exam was completed.  This patient is a 75 y.o. female with initial findings showing right flank pain    Plan:  Please see initial orders and work-up below.  This is to be continued with full evaluation in the main Emergency Department.     No current facility-administered medications for this encounter.     No results found for this or any previous visit (from the past 24 hour(s)).     Senaida Lange, PA-C  09/18/2022, 18:14

## 2022-09-18 NOTE — Discharge Instructions (Signed)
No acute process today on the CT scan or blood work.  Follow up with the primary care and general surgery to make sure getting better.  Push fluids take rest.  Trying pay attention to what sets off the back pain how long it lasts whether there is anything that makes it feel better or worse.  Pay attention if it bothers you to eat.  If you have new or worsening symptoms return to  the emergency department

## 2022-09-19 ENCOUNTER — Telehealth (HOSPITAL_COMMUNITY): Payer: Self-pay

## 2022-09-19 ENCOUNTER — Telehealth (INDEPENDENT_AMBULATORY_CARE_PROVIDER_SITE_OTHER): Payer: Self-pay | Admitting: Surgery

## 2022-09-19 ENCOUNTER — Encounter (INDEPENDENT_AMBULATORY_CARE_PROVIDER_SITE_OTHER): Payer: Self-pay | Admitting: Surgery

## 2022-09-19 NOTE — Progress Notes (Signed)
Post Ed Follow-Up    Post ED Follow-Up:   Document completed and/or attempted interactive contact(s) after transition to home after emergency department stay.:   Transition Facility and relevant Date:   ED follow up call deferred. Pt has made contact with her surgeon.       Cipriano Mile, RN  09/19/2022 13:18

## 2022-09-19 NOTE — Telephone Encounter (Signed)
Patient called stating she went to the ER last night and they did do her CT abd/ Pelv however it was without contrast, she would like you to review this, Please advise. Shelby Mattocks, LPN  55/04/3219 25:42

## 2022-09-20 NOTE — Telephone Encounter (Signed)
Tried to reach patient no answer, busy tone. Pamela Mattocks, LPN  32/06/9241 68:34

## 2022-09-21 ENCOUNTER — Other Ambulatory Visit (INDEPENDENT_AMBULATORY_CARE_PROVIDER_SITE_OTHER): Payer: Self-pay | Admitting: Family Medicine

## 2022-09-23 LAB — ADULT ROUTINE BLOOD CULTURE, SET OF 2 BOTTLES (BACTERIA AND YEAST)
BLOOD CULTURE, ROUTINE: NO GROWTH
BLOOD CULTURE, ROUTINE: NO GROWTH

## 2022-09-26 NOTE — Telephone Encounter (Signed)
Tried to reach patient no answer busy tone. Shelby Mattocks, LPN  98/33/8250 53:97

## 2022-10-03 NOTE — Telephone Encounter (Signed)
Tried to reach patient no answer, busy tone, will wait for patient to return our call. Shelby Mattocks, LPN  58/83/2549 82:64

## 2022-10-04 ENCOUNTER — Other Ambulatory Visit (HOSPITAL_COMMUNITY): Payer: Medicare Other

## 2022-10-24 ENCOUNTER — Ambulatory Visit (INDEPENDENT_AMBULATORY_CARE_PROVIDER_SITE_OTHER): Payer: Medicare Other | Admitting: Surgery

## 2022-10-24 ENCOUNTER — Encounter (INDEPENDENT_AMBULATORY_CARE_PROVIDER_SITE_OTHER): Payer: Self-pay | Admitting: Surgery

## 2022-10-24 ENCOUNTER — Ambulatory Visit (HOSPITAL_COMMUNITY): Payer: Self-pay

## 2022-10-24 ENCOUNTER — Other Ambulatory Visit: Payer: Self-pay

## 2022-10-24 VITALS — BP 156/78 | HR 96 | Temp 97.1°F | Resp 18 | Ht 62.0 in | Wt 162.0 lb

## 2022-10-24 DIAGNOSIS — R109 Unspecified abdominal pain: Secondary | ICD-10-CM

## 2022-10-25 ENCOUNTER — Encounter (INDEPENDENT_AMBULATORY_CARE_PROVIDER_SITE_OTHER): Payer: Self-pay | Admitting: Surgery

## 2022-10-25 NOTE — Progress Notes (Signed)
[facility]    Progress Note    Name: Pamela Christensen MRN:  [id number]   Date: 10/24/2022 Age: 77 y.o.            Date of Service:  10/25/2022  Pamela Christensen, Pamela Christensen, 77 y.o. female  Date of Birth:  [birth year]  PCP: No Pcp  Referring:  No ref. provider found     HPI:  Pamela Christensen is a 77 y.o. White female who returns for evaluation of right-sided abdominal pain with associated bloating and nausea.  The patient had a CT scan of the abdomen and pelvis performed on 09/18/2022 which was normal.          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      Allergies   Allergen Reactions    Bee Venom Protein (Honey Bee) Anaphylaxis           BP (!) 156/78   Pulse 96   Temp 36.2 C (97.1 F)   Resp 18   Ht 1.575 m (5\' 2" )   Wt 73.5 kg (162 lb)   SpO2 95%   BMI 29.63 kg/m          General: appropriate for age. in no acute distress.    Vital signs are present above and have been reviewed by me     HEENT: Atraumatic, Normocephalic.    Lungs: Nonlabored breathing with symmetric expansion    Heart:Regular wth respect to rate and rythmn.    Abdomen:Soft.  Slightly tender to deep palpation in right upper quadrant but no rebound guarding or peritoneal signs. Nondistended and benign    Psychiatric: Alert and oriented to person, place, and time. affect appropriate       Assessment/Plan:  Assessment/Plan   1. Right sided abdominal pain         Gallbladder ultrasound negative for stones HIDA scan with ejection  fraction    No follow-ups on file.     This note was partially created using voice recognition software and is inherently subject to errors including those of syntax and "sound alike " substitutions which may escape proof reading. In such instances, original meaning may be extrapolated by contextual derivation.    Devlin Mcveigh B Tericka Devincenzi, MD,MBA,FACS

## 2022-10-30 ENCOUNTER — Inpatient Hospital Stay (INDEPENDENT_AMBULATORY_CARE_PROVIDER_SITE_OTHER)
Admission: RE | Admit: 2022-10-30 | Discharge: 2022-10-30 | Disposition: A | Discharge status: Home / Self Care | Payer: Medicare Other | Source: Ambulatory Visit

## 2022-10-30 DIAGNOSIS — R109 Unspecified abdominal pain: Secondary | ICD-10-CM

## 2022-10-31 ENCOUNTER — Encounter (INDEPENDENT_AMBULATORY_CARE_PROVIDER_SITE_OTHER): Payer: Self-pay | Admitting: Surgery

## 2022-11-09 ENCOUNTER — Inpatient Hospital Stay
Admission: RE | Admit: 2022-11-09 | Discharge: 2022-11-09 | Disposition: A | Discharge status: Home / Self Care | Payer: Medicare Other | Source: Ambulatory Visit | Attending: Surgery | Admitting: Surgery

## 2022-11-09 ENCOUNTER — Other Ambulatory Visit: Payer: Self-pay

## 2022-11-09 DIAGNOSIS — R109 Unspecified abdominal pain: Secondary | ICD-10-CM | POA: Insufficient documentation

## 2022-11-09 DIAGNOSIS — R110 Nausea: Secondary | ICD-10-CM

## 2022-11-09 MED ORDER — SINCALIDE 5 MCG SOLUTION FOR INJECTION
INTRAMUSCULAR | Status: AC
Start: 2022-11-09 — End: 2022-11-09
  Filled 2022-11-09: qty 5

## 2022-11-09 MED ORDER — SINCALIDE 5 MCG SOLUTION FOR INJECTION
1.4000 ug | INTRAMUSCULAR | Status: AC
Start: 2022-11-09 — End: 2022-11-09
  Administered 2022-11-09: 1.4 ug via INTRAVENOUS

## 2022-11-21 ENCOUNTER — Ambulatory Visit (INDEPENDENT_AMBULATORY_CARE_PROVIDER_SITE_OTHER): Payer: Medicare Other | Admitting: Surgery

## 2022-11-21 ENCOUNTER — Other Ambulatory Visit: Payer: Self-pay

## 2022-11-21 ENCOUNTER — Encounter (INDEPENDENT_AMBULATORY_CARE_PROVIDER_SITE_OTHER): Payer: Self-pay | Admitting: Surgery

## 2022-11-21 VITALS — BP 162/77 | HR 94 | Temp 98.2°F | Ht 62.0 in | Wt 160.4 lb

## 2022-11-21 DIAGNOSIS — R12 Heartburn: Secondary | ICD-10-CM

## 2022-11-21 DIAGNOSIS — K219 Gastro-esophageal reflux disease without esophagitis: Secondary | ICD-10-CM

## 2022-11-21 DIAGNOSIS — Z0130 Encounter for examination of blood pressure without abnormal findings: Secondary | ICD-10-CM

## 2022-11-21 MED ORDER — SUCRALFATE 1 GRAM TABLET
1.0000 g | ORAL_TABLET | Freq: Four times a day (QID) | ORAL | 3 refills | Status: DC
Start: 2022-11-21 — End: 2022-11-21

## 2022-11-21 MED ORDER — SUCRALFATE 1 GRAM TABLET
1.0000 g | ORAL_TABLET | Freq: Two times a day (BID) | ORAL | 3 refills | Status: AC
Start: 2022-11-21 — End: ?

## 2022-11-24 ENCOUNTER — Encounter (INDEPENDENT_AMBULATORY_CARE_PROVIDER_SITE_OTHER): Payer: Self-pay | Admitting: Surgery

## 2022-11-24 NOTE — Progress Notes (Signed)
GENERAL SURGERY, [facility]AL [facility]GERY  [address]  [name] [address]    History and Physical     Name: Pamela Christensen MRN:  [id number]   Date: 11/21/2022 Age: 77 y.o.            Reason for Visit: Follow-up After Testing (HIDA scan )    History of Present Illness  [name] presents today for further evaluation because of GERD heartburn.  She would gallbladder testing done in which an ultrasound of the gallbladder was negative and HIDA scan was negative.    Negative diabetes, blood thinner      Review of the result(s) of each unique test:  Patient underwent diagnostic testing (as above  ) prior to this dates visit.  I have personally reviewed the results and that serves as a component of the medical decision making for this encounter       Review of prior external note(s) from each unique source:  Patients referral to this office including a recent assessment by the referring provider.  This was reviewed by me for this unique office visit for the indication and intent of the referral as well as any pertinent medical or surgical history relevant to the patients independent evaluation by me today.      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     Greater trochanteric bursitis of both hips     Mixed hyperlipidemia     Peripheral vascular disease, unspecified (CMS HCC)          Past Surgical History:   Procedure Laterality Date    BREAST SURGERY      tumor removal    HX APPENDECTOMY      HX TUMOR REMOVAL      head    SHOULDER SURGERY      tumor removal         Current Outpatient Medications   Medication Sig    aspirin 81 mg Oral Tablet, Chewable Chew 1 Tablet (81 mg total) Once a day    atorvastatin (LIPITOR) 20 mg Oral Tablet TAKE 1 TABLET ONCE DAILY IN THE EVENING    diclofenac sodium (VOLTAREN) 75 mg Oral Tablet, Delayed Release (E.C.) TAKE (1) TABLET TWICE A DAY.    ergocalciferol, vitamin D2, (DRISDOL) 1,250 mcg  (50,000 unit) Oral Capsule TAKE 1 CAPSULE ONCE A WEEK    mupirocin (BACTROBAN) 2 % Ointment Apply topically Three times a day    pregabalin (LYRICA) 75 mg Oral Capsule Take 1 Capsule (75 mg total) by mouth Twice daily    RABEprazole (ACIPHEX) 20 mg Oral Tablet, Delayed Release (E.C.) Take 1 Tablet (20 mg total) by mouth Once a day    sucralfate (CARAFATE) 1 gram Oral Tablet Take 1 Tablet (1 g total) by mouth Twice a day before meals    traMADoL (ULTRAM) 50 mg Oral Tablet Take 1 Tablet (50 mg total) by mouth Three times a day as needed for Pain     Allergies   Allergen Reactions    Bee Venom Protein (Honey Bee) Anaphylaxis     Family Medical History:       Problem Relation (Age of Onset)    Breast Cancer Sister    Dementia Father    Lung Cancer Sister    Parkinsons Disease Mother    Prostate Cancer Father  Social History     Tobacco Use    Smoking status: Every Day     Packs/day: 1.00     Years: 40.00     Additional pack years: 0.00     Total pack years: 40.00     Types: Cigarettes    Smokeless tobacco: Never   Vaping Use    Vaping Use: Never used   Substance Use Topics    Alcohol use: Never    Drug use: Never            Physical Examination:  Vitals:    11/21/22 1511   BP: (!) 162/77   Pulse: 94   Temp: 36.8 C (98.2 F)   SpO2: 96%   Weight: 72.8 kg (160 lb 6.4 oz)   Height: 1.575 m (5' 2"$ )   BMI: 29.4        General: appropriate for age. in no acute distress.    Vital signs are present above and have been reviewed by me     HEENT: Atraumatic, Normocephalic. PERRLA. EOMI. Nose clear. Throat clear    Lungs: Nonlabored breathing with symmetric expansion. Clear to auscultation bilaterally    Heart:Regular wth respect to rate and rythmn.    Abdomen:Soft. Nontender. Nondistended and benign    Extremities: Grossly normal. No major deformities     Neuro:  Grossly normal motor and sensory function    Psychiatric: Alert and oriented to person, place, and time. affect appropriate      Assessment and Plan  EGD with  biopsy scheduled for 01/31/2023 at 10:00 a.m..    The prescription for Carafate 1 g b.i.d. will be sent to [facility] pharmacy      Follow Up:  No follow-ups on file.      ICD-10-CM    1. Gastroesophageal reflux disease, unspecified whether esophagitis present  K21.9       2. Heartburn  R12           Feras Gardella B Krishav Mamone, MD ,MBA,FACS    I appreciate the opportunity to be involved in the care of your patients.  If you have any questions or concerns regarding this encounter, please do not hesitate to contact me at your convenience.      This note may have been partially generated using MModal Fluency Direct system, and there may be some incorrect words, spellings, and punctuation that were not noted in checking the note before saving, though effort was made to avoid such errors.

## 2022-12-13 ENCOUNTER — Telehealth (INDEPENDENT_AMBULATORY_CARE_PROVIDER_SITE_OTHER): Payer: Self-pay | Admitting: Surgery

## 2022-12-13 NOTE — Telephone Encounter (Signed)
Patient wants to wait on CT at this time, she has EGD scheduled for April and wants to see results to that. Shelby Mattocks, LPN  [phone]

## 2022-12-13 NOTE — Addendum Note (Signed)
Addended by: Ulyess Blossom on: 12/13/2022 02:37 PM     Modules accepted: Orders

## 2023-01-31 ENCOUNTER — Other Ambulatory Visit: Payer: Self-pay

## 2023-01-31 ENCOUNTER — Ambulatory Visit (HOSPITAL_COMMUNITY): Payer: Medicare Other | Admitting: Certified Registered"

## 2023-01-31 ENCOUNTER — Inpatient Hospital Stay
Admission: RE | Admit: 2023-01-31 | Discharge: 2023-01-31 | Disposition: A | Discharge status: Home / Self Care | Payer: Medicare Other | Source: Ambulatory Visit | Attending: Surgery | Admitting: Surgery

## 2023-01-31 ENCOUNTER — Encounter (HOSPITAL_COMMUNITY)
Admission: RE | Disposition: A | Discharge status: Home / Self Care | Payer: Self-pay | Source: Ambulatory Visit | Attending: Surgery

## 2023-01-31 ENCOUNTER — Encounter (HOSPITAL_COMMUNITY): Payer: Self-pay | Admitting: Surgery

## 2023-01-31 ENCOUNTER — Encounter (HOSPITAL_COMMUNITY): Payer: Medicare Other | Admitting: Surgery

## 2023-01-31 DIAGNOSIS — K219 Gastro-esophageal reflux disease without esophagitis: Secondary | ICD-10-CM | POA: Insufficient documentation

## 2023-01-31 DIAGNOSIS — G9520 Unspecified cord compression: Secondary | ICD-10-CM | POA: Insufficient documentation

## 2023-01-31 DIAGNOSIS — K2970 Gastritis, unspecified, without bleeding: Secondary | ICD-10-CM | POA: Insufficient documentation

## 2023-01-31 DIAGNOSIS — E785 Hyperlipidemia, unspecified: Secondary | ICD-10-CM | POA: Insufficient documentation

## 2023-01-31 DIAGNOSIS — I739 Peripheral vascular disease, unspecified: Secondary | ICD-10-CM | POA: Insufficient documentation

## 2023-01-31 DIAGNOSIS — D689 Coagulation defect, unspecified: Secondary | ICD-10-CM | POA: Insufficient documentation

## 2023-01-31 DIAGNOSIS — J449 Chronic obstructive pulmonary disease, unspecified: Secondary | ICD-10-CM | POA: Insufficient documentation

## 2023-01-31 DIAGNOSIS — Z87442 Personal history of urinary calculi: Secondary | ICD-10-CM | POA: Insufficient documentation

## 2023-01-31 DIAGNOSIS — K3189 Other diseases of stomach and duodenum: Secondary | ICD-10-CM | POA: Insufficient documentation

## 2023-01-31 DIAGNOSIS — F17210 Nicotine dependence, cigarettes, uncomplicated: Secondary | ICD-10-CM | POA: Insufficient documentation

## 2023-01-31 SURGERY — GASTROSCOPY WITH BIOPSY
Anesthesia: General | Wound class: Clean Contaminated Wounds-The respiratory, GI, Genital, or urinary

## 2023-01-31 MED ORDER — LIDOCAINE (PF) 100 MG/5 ML (2 %) INTRAVENOUS SYRINGE
INJECTION | Freq: Once | INTRAVENOUS | Status: DC | PRN
Start: 2023-01-31 — End: 2023-01-31
  Administered 2023-01-31: 100 mg via INTRAVENOUS

## 2023-01-31 MED ORDER — DEXTROSE 5 % AND LACTATED RINGERS INTRAVENOUS SOLUTION
INTRAVENOUS | Status: DC | PRN
Start: 2023-01-31 — End: 2023-01-31
  Administered 2023-01-31: 0 via INTRAVENOUS

## 2023-01-31 MED ORDER — PROPOFOL 10 MG/ML IV BOLUS
INJECTION | Freq: Once | INTRAVENOUS | Status: DC | PRN
Start: 2023-01-31 — End: 2023-01-31
  Administered 2023-01-31: 100 mg via INTRAVENOUS

## 2023-01-31 SURGICAL SUPPLY — 2 items
CLEANER INSTRUMENT PRE-KLENZ 13.5 OZ (MISCELLANEOUS PT CARE ITEMS) ×1 IMPLANT
FORCEPS BIOPSY MICROMESH TTH STREAMLINE CATH NEEDLE 240CM 2.4MM RJ 4 SS LRG CPC STRL DISP ORNG 2.8MM (ENDOSCOPIC SUPPLIES) ×1 IMPLANT

## 2023-01-31 NOTE — H&P (Signed)
[facility]  General Surgery  History and Physical    Date of Service:  01/31/2023  Pamela, Christensen, 77 y.o. female  Date of Admission:  (Not on file)  Date of Birth:  [birth year]  PCP: No Pcp    Reason for admission:  EGD with biopsy    HPI:  Pamela Christensen is a 77 y.o. White female who is admitted for GERD  HEART BURN   Pamela Christensen presents today for further evaluation because of GERD heartburn.  She would gallbladder testing done in which an ultrasound of the gallbladder was negative and HIDA scan was negative.     Negative diabetes, blood thinner        Review of the result(s) of each unique test:  Patient underwent diagnostic testing (as above  ) prior to this dates visit.  I have personally reviewed the results and that serves as a component of the medical decision making for this encounter        Review of prior external note(s) from each unique source:  Patients referral to this office including a recent assessment by the referring provider.  This was reviewed by me for this unique office visit for the indication and intent of the referral as well as any pertinent medical or surgical history relevant to the patients independent evaluation by me today.         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     Greater trochanteric bursitis of both hips     Mixed hyperlipidemia     Peripheral vascular disease, unspecified (CMS HCC)       Past Surgical History:   Procedure Laterality Date    BREAST SURGERY      tumor removal    HX APPENDECTOMY      HX TUMOR REMOVAL      head    SHOULDER SURGERY      tumor removal      Social History     Tobacco Use    Smoking status: Every Day     Current packs/day: 1.00     Average packs/day: 1 pack/day for 40.0 years (40.0 ttl pk-yrs)     Types: Cigarettes    Smokeless tobacco: Never   Vaping Use    Vaping status: Never Used   Substance Use Topics    Alcohol use: Never    Drug use: Never       Family Medical  History:       Problem Relation (Age of Onset)    Breast Cancer Sister    Dementia Father    Lung Cancer Sister    Parkinsons Disease Mother    Prostate Cancer Father            Cannot display prior to admission medications because the patient has not been admitted in this contact.           Allergies   Allergen Reactions    Bee Venom Protein (Honey Bee) Anaphylaxis          No data found.       General: appropriate for age. in no acute distress.    Vital signs are present above and have been reviewed by me     HEENT: Atraumatic, Normocephalic. PERRLA, EOMI. Nose clear. Throat clear.    Lungs: Nonlabored breathing with symmetric expansion.  Clear to auscultation bilaterally    Heart:Regular wth  respect to rate and rythmn.    Abdomen:Soft. Nontender. Nondistended and benign    Extremities:  Grossly normal with good range of motion and no major deformities.    Neuro:  Grossly normal motor and sensory function. CN's II through XII intact.    Psychiatric: Alert and oriented to person, place, and time. affect appropriate    Laboratory Data:     No results found for any visits on 01/31/23 (from the past 24 hour(s)).    Imaging Studies:    No orders to display        Assessment/Plan:  GERD  HEART BURN    EGD with biopsy scheduled for Wednesday January 31, 2023     Discussed indications, risks, and benefits of EGD with biopsy with the patient.  Discussed the possibility of polypectomy, biopsies, and possible repeat examinations.  Risks include bleeding, sedation risks, possibility of missed diagnosis of polyp or malignancy, and remote possibilities of perforation and death.  All questions were answered, and informed consent was clearly obtained.    This note was partially created using voice recognition software and is inherently subject to errors including those of syntax and "sound alike " substitutions which may escape proof reading. In such instances, original meaning may be extrapolated by contextual derivation.    Fidela Juneau, MD, MBA, FACS

## 2023-01-31 NOTE — Anesthesia Postprocedure Evaluation (Signed)
Anesthesia Post Op Evaluation    Patient: Pamela Christensen  Procedure(s):  EGD WITH BIOPSY    Last Vitals:Temperature: 36.8 C (98.2 F) (01/31/23 0903)  Heart Rate: 79 (01/31/23 0903)  BP (Non-Invasive): (!) 140/61 (01/31/23 0903)  Respiratory Rate: 20 (01/31/23 0903)  SpO2: 91 % (01/31/23 0903)    No notable events documented.    Patient is sufficiently recovered from the effects of anesthesia to participate in the evaluation and has returned to their pre-procedure level.  Patient location during evaluation: PACU       Patient participation: complete - patient participated  Level of consciousness: awake and alert and responsive to verbal stimuli    Pain management: adequate  Airway patency: patent    Anesthetic complications: no  Cardiovascular status: acceptable  Respiratory status: acceptable  Hydration status: acceptable  Patient post-procedure temperature: Pt Normothermic   PONV Status: Absent

## 2023-01-31 NOTE — Anesthesia Preprocedure Evaluation (Addendum)
ANESTHESIA PRE-OP EVALUATION  Planned Procedure: EGD WITH BIOPSY  Review of Systems     anesthesia history negative     patient summary reviewed  nursing notes reviewed        Pulmonary   COPD, mild and current smoker,   Cardiovascular    ECG reviewed, PVD and hyperlipidemia , Exercise Tolerance: > or = 4 METS        GI/Hepatic/Renal    GERD and kidney stones        Endo/Other    drug induced coagulopathy,      Neuro/Psych/MS    back abnormality (spinal cord compression), Neck problems     Cancer    negative hematology/oncology ROS,               Physical Assessment      Airway       Mallampati: III    TM distance: <3 FB    Neck ROM: limited  Mouth Opening: good.            Dental                 Comment: Lower tooth abscess        Pulmonary    Breath sounds clear to auscultation       Cardiovascular    Rhythm: regular  Rate: Normal       Other findings          Plan  ASA 3     Planned anesthesia type: general     general intravenous                    Intravenous induction       Anesthetic plan and risks discussed with patient  signed consent obtained          Patient's NPO status is appropriate for Anesthesia.

## 2023-01-31 NOTE — Discharge Instructions (Addendum)
SURGICAL DISCHARGE INSTRUCTIONS     Dr. Donnal Debar, Gene B, MD  performed your EGD WITH BIOPSY today at the [facility]      [facility]:  Monday through Friday from 8 a.m. - 4 p.m.: [phone]    For T&D: [phone]  Between 4 p.m. - 8 a.m., weekends and holidays:  Call ER [phone]    PLEASE SEE WRITTEN HANDOUTS AS DISCUSSED BY YOUR NURSE:  GERD, gastritis      ANESTHESIA INFORMATION   ANESTHESIA -- ADULT PATIENTS:  You have received intravenous sedation / general anesthesia, and you may feel drowsy and light-headed for several hours. You may even experience some forgetfulness of the procedure. DO NOT DRIVE A MOTOR VEHICLE or perform any activity requiring complete alertness or coordination until you feel fully awake in about 24-48 hours. Do not drink alcoholic beverages for at least 24 hours. Do not stay alone, you must have a responsible adult available to be with you. You may also experience a dry mouth or nausea for 24 hours. This is a normal side effect and will disappear as the effects of the medication wear off.    REMEMBER   If you experience any difficulty breathing, chest pain, bleeding that you feel is excessive, persistent nausea or vomiting or for any other concerns:  Call your physician Dr.  Donnal Debar, Cherylann Parr, MD   at [phone] . You may also ask to have the general doctor on call paged. They are available to you 24 hours a day.      SPECIAL INSTRUCTIONS / COMMENTS   Findings today: moderate gastritis   Continue previous stomach medications and you may add GAVISCON to improve symptoms.  FOLLOW-UP APPOINTMENTS   Please call your surgeon's office at the number listed to schedule a date / time of return for follow-up.

## 2023-01-31 NOTE — OR Surgeon (Addendum)
[facility]    Patient Name: Pamela Christensen, Pamela Christensen Number: [id number]  Date of Service: 01/31/2023   Date of Birth: 11-Nov-1945      Pre-Operative Diagnosis: GERD;HEART BURN     Post-Operative Diagnosis: moderate gastritis    Procedure(s)/Description:  EGD WITH BIOPSY: 43239 (CPT)     Attending Surgeon: Fidela Juneau, MD     Anesthesia:  CRNA: Magdalene Patricia, CRNA    Anesthesia Type: .General     Estimated Blood Loss:  Minimal    The patient indicates that they have read and understood the preoperative endoscopy consent form. The benefits, risks and alternatives to the procedure were discussed as well as specifically the risk of bleeding and/or perforation requiring operation. The patient indicates they have no further question and wish to proceed. Informed consent was obtained from the patient and/or medical power of attorney.  The patient was brought in to the endoscopy suite and placed on the stretcher in the left lateral decubitus position. The video gastroscope was then inserted into the mouth, down the esophagus and into the stomach after adequate IV and topical anesthetic was provided. The stomach was insufflated with air and examination of the stomach was performed. Antral biopsy was obtained and sent to the Pathology department for microscopic examination. Hemostasis was well obtained.  The scope was advanced to the pyloric channel.  The pylorus was cannulated and the scope was advanced into the duodenum without any difficulty. Examination of the first and second portions of the duodenum was performed and the findings were noted as above. The scope was withdrawn back into the stomach in which an extensive examination was performed with the above noted findings. Retroflexion of the scope was performed which gave good visualization of the proximal stomach and GE junction from below. The scope was pulled back up into the proximal stomach and GE junction, with the above noted findings.  The  scope was withdrawn back up into the esophagus and out the mouth without any difficulty. The patient tolerated the procedure well. No complications were encountered.   There were no unplanned events.  EKG, pulse, pulse oximetry and blood pressure were monitored throughout the entire procedure.    The patient will continue taking her Carafate and AcipHex.    Michaline Kindig B. Kamaile Zachow, MD, MBA, FACS  [facility] -General Surgery

## 2023-02-01 ENCOUNTER — Telehealth (INDEPENDENT_AMBULATORY_CARE_PROVIDER_SITE_OTHER): Payer: Self-pay | Admitting: Surgery

## 2023-02-01 DIAGNOSIS — K2970 Gastritis, unspecified, without bleeding: Secondary | ICD-10-CM

## 2023-02-01 LAB — SURGICAL PATHOLOGY SPECIMEN

## 2023-02-06 ENCOUNTER — Other Ambulatory Visit: Payer: Self-pay

## 2023-02-06 ENCOUNTER — Ambulatory Visit (INDEPENDENT_AMBULATORY_CARE_PROVIDER_SITE_OTHER): Payer: Medicare Other | Admitting: OTOLARYNGOLOGY

## 2023-02-06 ENCOUNTER — Encounter (INDEPENDENT_AMBULATORY_CARE_PROVIDER_SITE_OTHER): Payer: Self-pay | Admitting: OTOLARYNGOLOGY

## 2023-02-06 VITALS — Ht 62.0 in | Wt 160.0 lb

## 2023-02-06 DIAGNOSIS — J3489 Other specified disorders of nose and nasal sinuses: Secondary | ICD-10-CM

## 2023-02-06 DIAGNOSIS — J342 Deviated nasal septum: Secondary | ICD-10-CM

## 2023-02-06 DIAGNOSIS — R040 Epistaxis: Secondary | ICD-10-CM

## 2023-02-06 NOTE — Procedures (Signed)
ENT, [facility]  [address]  [place] [address]    Procedure Note    Name: Pamela Christensen MRN:  [id number]   Date: 02/06/2023 DOB:  1946/08/13 (77 y.o.)         31238 - NASAL/SINUS ENDOSCOPY W/ CONTROL OF NASAL HEMORRHAGE (AMB ONLY)    Performed by: Conchita Paris, DO  Authorized by: Conchita Paris, DO    Time Out:     Immediately before the procedure, a time out was called:  Yes    Patient verified:  Yes    Procedure Verified:  Yes    Site Verified:  Yes  Documentation:        Endoscopic Control of Epistaxis    Topical Anesthesia:  Numbs Solution  Bleeding site:   right minimal nasal septum with ectatic vessels  Cauterized with 2 Silver Nitrate applicator(s)  Nasal packing: none      Pre op Diagnosis:  Epistaxis  Post op Diagnosis:  Epistaxis     Details of procedure:  After thorough discussion regarding details of this procedure, including the risks and benefits, the patient elected to proceed with the procedure.    Under local anesthesia without sedation, the patient was appropriately prepped and position.  Topical nasal solution 2 sprays into both nostrils for vasoconstrictive and anesthetic affect.      An endoscope was then inserted into both nasal vault, and the nasal vault were inspected.  No evidence of angiofibroma, or other posterior masses were noted.  The middle meatus free of disease bilaterally.     Using the endoscope, the blood vessels on the anterior portion of the nasal septum were identified.  Using silver nitrate the specific locations were cauterized.  Nasal packing inserted    Patient tolerated procedure well       Conchita Paris, DO

## 2023-02-06 NOTE — H&P (Signed)
ENT, [facility]  [address]  [place] [address]    Progress Note    Name: Pamela Christensen MRN:  [id number]   Date: 02/06/2023 DOB:  08-03-1946 (77 y.o.)              Follow Up      Subjective:   Chief Complaint:   Epistaxis (Continues to have epistaxis since using the mupirocin. States last episode was a month ago from right nostril. Also states having lesion inside left nostril.)       History of Present Illness:  Pamela Christensen is a 77 y.o. old female who presents to the clinic for follow-up. Patient states that she has been having right sided epistaxis.  Last episode was yesterday.  She also thinks she has a scab int he left nostril     Review of Systems     Physical Exam:     Vitals:    02/06/23 1359   Weight: 72.6 kg (160 lb)   Height: 1.575 m ( )   BMI: 29.33      ENT Physical Exam  Constitutional  Appearance: patient appears well-developed, well-nourished and well-groomed,  Communication/Voice: communication appropriate for developmental age; vocal quality normal;  Head and Face  Appearance: head appears normal, face appears normal and face appears atraumatic;  Palpation: facial palpation normal;  Salivary: glands normal;  Ear  Hearing: intact;  Auricles: right auricle normal; left auricle normal;  External Mastoids: right external mastoid normal; left external mastoid normal;  Ear Canals: right ear canal normal; left ear canal normal;  Tympanic Membranes: right tympanic membrane normal; left tympanic membrane normal;  Nose  External Nose: nares patent bilaterally; external nose normal;  Internal Nose: nasal mucosa normal; nasal septal deviation present; right prominent septal vessel present; bilateral inferior turbinates normal;  Oral Cavity/Oropharynx  Lips: normal;  Teeth: normal;  Gums: gingiva normal;  Tongue: normal;  Oral mucosa: normal;  Hard palate: normal;  Neck  Neck: neck normal; neck palpation normal;  Thyroid: thyroid normal;  Respiratory  Inspection: breathing unlabored; normal  breathing rate;  Lymphatic  Palpation: lymph nodes normal;  Neurovestibular  Mental Status: alert and oriented;  Psychiatric: mood normal; affect is appropriate;  Cranial Nerves: cranial nerves intact;       Assessment and Plan:       ICD-10-CM    1. Epistaxis  R04.0 47829 - NASAL/SINUS ENDOSCOPY W/ CONTROL OF NASAL HEMORRHAGE (AMB ONLY)      2. DNS (deviated nasal septum)  J34.2         Orders Placed This Encounter    56213 - NASAL/SINUS ENDOSCOPY W/ CONTROL OF NASAL HEMORRHAGE (AMB ONLY)   Nasal endo with cautery  Follow post op instruction  Start bactroban.      Follow up:  Return in about 4 weeks (around 03/06/2023).    Conchita Paris, DO

## 2023-02-27 ENCOUNTER — Other Ambulatory Visit: Payer: Self-pay

## 2023-02-27 ENCOUNTER — Encounter (INDEPENDENT_AMBULATORY_CARE_PROVIDER_SITE_OTHER): Payer: Self-pay | Admitting: Surgery

## 2023-02-27 ENCOUNTER — Ambulatory Visit (INDEPENDENT_AMBULATORY_CARE_PROVIDER_SITE_OTHER): Payer: Medicare Other | Admitting: Surgery

## 2023-02-27 VITALS — BP 172/59 | HR 82 | Temp 97.5°F | Ht 62.0 in | Wt 162.0 lb

## 2023-02-27 DIAGNOSIS — R1031 Right lower quadrant pain: Secondary | ICD-10-CM

## 2023-02-27 DIAGNOSIS — K2970 Gastritis, unspecified, without bleeding: Secondary | ICD-10-CM

## 2023-02-27 MED ORDER — METHYLPREDNISOLONE 4 MG TABLETS IN A DOSE PACK
ORAL_TABLET | ORAL | 0 refills | Status: DC
Start: 2023-02-27 — End: 2023-03-13

## 2023-02-27 NOTE — Progress Notes (Signed)
GENERAL SURGERY, [facility]  [address]  [place] [address]    Progress Note    Name: Pamela Christensen MRN:  [id number]   Date: 02/27/2023 DOB:  Mar 29, 1946 (77 y.o.)                Date of Service:  02/27/2023  Pamela Christensen, 77 y.o. female  Date of Birth:  1946-08-19  PCP: No Pcp  Referring:  No ref. provider found     HPI:  Pamela Christensen is a 77 y.o. White female who returns after undergoing EGD.  The patient states she was occasional discomfort along the right lower abdomen.  She does not describe the pain to be very severe but only occasional mild discomfort such as a pulling sensation.  No nausea or vomiting.  The patient was had extensive workup including EGD which showed moderate gastritis for which she takes Carafate and AcipHex.  The patient had gallbladder workup showing normal ultrasound and a gallbladder ejection fraction 57%.  CT scan of the abdomen and pelvis was normal.      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     Greater trochanteric bursitis of both hips     Mixed hyperlipidemia     Peripheral vascular disease, unspecified (CMS HCC)       Past Surgical History:   Procedure Laterality Date    BREAST SURGERY      tumor removal    ESOPHAGOGASTRODUODENOSCOPY      HX APPENDECTOMY      HX TUMOR REMOVAL      head    SHOULDER SURGERY      tumor removal      Outpatient Medications Marked as Taking for the 02/27/23 encounter (Office Visit) with Giulian Goldring, Cherylann Parr, MD   Medication Sig    aspirin 81 mg Oral Tablet, Chewable Chew 1 Tablet (81 mg total) Once a day    atorvastatin (LIPITOR) 20 mg Oral Tablet TAKE 1 TABLET ONCE DAILY IN THE EVENING    diclofenac sodium (VOLTAREN) 75 mg Oral Tablet, Delayed Release (E.C.) TAKE (1) TABLET TWICE A DAY.    ergocalciferol, vitamin D2, (DRISDOL) 1,250 mcg (50,000 unit) Oral Capsule TAKE 1 CAPSULE ONCE A WEEK    mupirocin (BACTROBAN) 2 % Ointment Apply topically Three times a day     pregabalin (LYRICA) 75 mg Oral Capsule Take 1 Capsule (75 mg total) by mouth Twice daily    RABEprazole (ACIPHEX) 20 mg Oral Tablet, Delayed Release (E.C.) Take 1 Tablet (20 mg total) by mouth Once a day    sucralfate (CARAFATE) 1 gram Oral Tablet Take 1 Tablet (1 g total) by mouth Twice a day before meals    traMADoL (ULTRAM) 50 mg Oral Tablet Take 1 Tablet (50 mg total) by mouth Three times a day as needed for Pain      Allergies   Allergen Reactions    Bee Venom Protein (Honey Bee) Anaphylaxis    Bee Pollen            BP (!) 172/59 (Site: Left, Patient Position: Sitting)   Pulse 82   Temp 36.4 C (97.5 F)   Ht 1.575 m (5\' 2" )   Wt 73.5 kg (162 lb)   SpO2 93%   BMI 29.63 kg/m          General: appropriate for age. in no acute distress.  Vital signs are present above and have been reviewed by me     HEENT: Atraumatic, Normocephalic.    Lungs: Nonlabored breathing with symmetric expansion    Heart:Regular wth respect to rate and rythmn.    Abdomen:Soft. Nontender. Nondistended and benign    Psychiatric: Alert and oriented to person, place, and time. affect appropriate       Assessment/Plan:  Assessment/Plan   1. Right lower quadrant abdominal pain         I discussed with the patient the findings that most likely since the workup has been negative that her discomfort that she notices is from adhesions and scar tissue from prior surgery.  The patient states that she agrees to this because she has had extensive lower abdominal surgery and the examination of her lower abdomen shows extensive scar the lower midline and right lower quadrant.  I told the patient that if in the future she gets to a point where she can not tolerate the pain and discomfort anymore than I would recommend a diagnostic laparoscopy with lysis of adhesions.  She understood and agreed with that plan.  She will call back or return to clinic as needed.  The patient was very appreciative of the care given.    The patient states that she has  poison oak and was requesting if I can send a prescription for prednisone.  Return if symptoms worsen or fail to improve.     This note was partially created using voice recognition software and is inherently subject to errors including those of syntax and "sound alike " substitutions which may escape proof reading. In such instances, original meaning may be extrapolated by contextual derivation.    Chong January B Taimur Fier, MD,MBA,FACS

## 2023-03-13 ENCOUNTER — Encounter (INDEPENDENT_AMBULATORY_CARE_PROVIDER_SITE_OTHER): Payer: Self-pay | Admitting: OTOLARYNGOLOGY

## 2023-03-13 ENCOUNTER — Other Ambulatory Visit: Payer: Self-pay

## 2023-03-13 ENCOUNTER — Ambulatory Visit (INDEPENDENT_AMBULATORY_CARE_PROVIDER_SITE_OTHER): Payer: Medicare Other | Admitting: OTOLARYNGOLOGY

## 2023-03-13 VITALS — Ht 62.0 in | Wt 162.0 lb

## 2023-03-13 DIAGNOSIS — J342 Deviated nasal septum: Secondary | ICD-10-CM

## 2023-03-13 DIAGNOSIS — R040 Epistaxis: Secondary | ICD-10-CM

## 2023-03-13 MED ORDER — MUPIROCIN 2 % TOPICAL OINTMENT
TOPICAL_OINTMENT | Freq: Two times a day (BID) | CUTANEOUS | 1 refills | Status: AC
Start: 2023-03-13 — End: ?

## 2023-03-13 MED ORDER — MUPIROCIN 2 % TOPICAL OINTMENT
TOPICAL_OINTMENT | Freq: Three times a day (TID) | CUTANEOUS | 1 refills | Status: AC
Start: 2023-03-13 — End: ?

## 2023-03-13 NOTE — H&P (Signed)
ENT, [facility]  [address]  [place] [address]    Return Patient Visit    Name: Pamela Christensen MRN:  [id number]   Date: 03/13/2023 DOB: 03-Dec-1945 (77 y.o.)       Referring Provider:  No ref. provider found    Reason for Visit:   Chief Complaint   Patient presents with    Epistaxis     Rc on epistaxis.        History of Present Illness:  Pamela Christensen is a 77 y.o. female who is FU on epistaxis. No recurrence of epistaxis. Using mupirocin and healing well. No complaints.       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    Gastroesophageal reflux disease    Mixed hyperlipidemia    Cervical radicular pain    Degenerative scoliosis    Peripheral vascular disease, unspecified (CMS HCC)    Spinal stenosis of lumbar region with neurogenic claudication    Lumbar pain    Right hip pain    Greater trochanteric bursitis of right hip    Costovertebral angle tenderness     Current Outpatient Medications   Medication Sig    aspirin 81 mg Oral Tablet, Chewable Chew 1 Tablet (81 mg total) Once a day    atorvastatin (LIPITOR) 20 mg Oral Tablet TAKE 1 TABLET ONCE DAILY IN THE EVENING    diclofenac sodium (VOLTAREN) 75 mg Oral Tablet, Delayed Release (E.C.) TAKE (1) TABLET TWICE A DAY.    ergocalciferol, vitamin D2, (DRISDOL) 1,250 mcg (50,000 unit) Oral Capsule TAKE 1 CAPSULE ONCE A WEEK    mupirocin (BACTROBAN) 2 % Ointment Apply topically Twice daily    mupirocin (BACTROBAN) 2 % Ointment Apply topically Three times a day    pregabalin (LYRICA) 75 mg Oral Capsule Take 1 Capsule (75 mg total) by mouth Twice daily    RABEprazole  (ACIPHEX) 20 mg Oral Tablet, Delayed Release (E.C.) Take 1 Tablet (20 mg total) by mouth Once a day    sucralfate (CARAFATE) 1 gram Oral Tablet Take 1 Tablet (1 g total) by mouth Twice a day before meals    traMADoL (ULTRAM) 50 mg Oral Tablet Take 1 Tablet (50 mg total) by mouth Three times a day as needed for Pain      Allergies   Allergen Reactions    Bee Venom Protein (Honey Bee) Anaphylaxis    Bee Pollen      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     Greater trochanteric bursitis of both hips     Mixed hyperlipidemia     Peripheral vascular disease, unspecified (CMS HCC)      Past Surgical History:   Procedure Laterality Date    BREAST SURGERY      tumor removal    ESOPHAGOGASTRODUODENOSCOPY      HX APPENDECTOMY      HX TUMOR REMOVAL      head    SHOULDER SURGERY  tumor removal     Family Medical History:       Problem Relation (Age of Onset)    Breast Cancer Sister    Dementia Father    Lung Cancer Sister    Parkinsons Disease Mother    Prostate Cancer Father            Social History     Tobacco Use    Smoking status: Every Day     Current packs/day: 1.00     Average packs/day: 1 pack/day for 40.0 years (40.0 ttl pk-yrs)     Types: Cigarettes    Smokeless tobacco: Never   Vaping Use    Vaping status: Never Used   Substance Use Topics    Alcohol use: Never    Drug use: Never       Review of Systems:  Review of Systems    Physical Exam:  Ht 1.575 m (5\' 2" )   Wt 73.5 kg (162 lb)   BMI 29.63 kg/m       ENT Physical Exam  Constitutional  Appearance: patient appears well-developed, well-nourished and well-groomed,  Communication/Voice: communication appropriate for developmental age; vocal quality normal;  Head and Face  Appearance: head appears normal, face appears normal and face appears atraumatic;  Palpation: facial palpation normal;  Salivary: glands normal;  Ear  Hearing: intact;  Auricles: right auricle normal; left auricle normal;  External  Mastoids: right external mastoid normal; left external mastoid normal;  Ear Canals: right ear canal normal; left ear canal normal;  Tympanic Membranes: right tympanic membrane normal; left tympanic membrane normal;  Nose  External Nose: nares patent bilaterally; external nose normal;  Internal Nose: nasal mucosa normal; nasal septal deviation present; Septum comments: R ant septum healing well. No active bleeding stires.   bilateral inferior turbinates normal;  Oral Cavity/Oropharynx  Lips: normal;  Teeth: normal;  Gums: gingiva normal;  Tongue: normal;  Oral mucosa: normal;  Hard palate: normal;  Neck  Neck: neck normal; neck palpation normal;  Thyroid: thyroid normal;  Respiratory  Inspection: breathing unlabored; normal breathing rate;  Lymphatic  Palpation: lymph nodes normal;  Neurovestibular  Mental Status: alert and oriented;  Psychiatric: mood normal; affect is appropriate;  Cranial Nerves: cranial nerves intact;       Assessment:  ENCOUNTER DIAGNOSES     ICD-10-CM   1. Epistaxis  R04.0   2. DNS (deviated nasal septum)  J34.2       Plan:  Medical records reviewed on 03/13/2023.  Refilled mupirocin. Septum healing well.   Orders Placed This Encounter    mupirocin (BACTROBAN) 2 % Ointment    mupirocin (BACTROBAN) 2 % Ointment     Return in about 6 months (around 09/13/2023).    Marcelline Deist, PA-C  The advanced practice clinician's documentation was reviewed/amended in its entirety with the assessment and plan portion completely performed independently by me during this separate encounter.

## 2023-04-13 ENCOUNTER — Emergency Department
Admission: EM | Admit: 2023-04-13 | Discharge: 2023-04-13 | Disposition: A | Discharge status: Home / Self Care | Payer: Medicare Other | Attending: NURSE PRACTITIONER | Admitting: NURSE PRACTITIONER

## 2023-04-13 ENCOUNTER — Emergency Department (HOSPITAL_COMMUNITY): Payer: Medicare Other

## 2023-04-13 ENCOUNTER — Other Ambulatory Visit: Payer: Self-pay

## 2023-04-13 ENCOUNTER — Encounter (HOSPITAL_COMMUNITY): Payer: Self-pay | Admitting: NURSE PRACTITIONER

## 2023-04-13 DIAGNOSIS — W208XXA Other cause of strike by thrown, projected or falling object, initial encounter: Secondary | ICD-10-CM | POA: Insufficient documentation

## 2023-04-13 DIAGNOSIS — S61412A Laceration without foreign body of left hand, initial encounter: Secondary | ICD-10-CM

## 2023-04-13 DIAGNOSIS — W228XXA Striking against or struck by other objects, initial encounter: Secondary | ICD-10-CM

## 2023-04-13 DIAGNOSIS — Z23 Encounter for immunization: Secondary | ICD-10-CM | POA: Insufficient documentation

## 2023-04-13 MED ORDER — DIPHTH,PERTUSSIS(ACEL),TETANUS 2.5 LF UNIT-8 MCG-5 LF/0.5ML IM SYRINGE
INJECTION | INTRAMUSCULAR | Status: AC
Start: 2023-04-13 — End: 2023-04-13
  Filled 2023-04-13: qty 0.5

## 2023-04-13 MED ORDER — DIPHTH,PERTUSSIS(ACEL),TETANUS 2.5 LF UNIT-8 MCG-5 LF/0.5ML IM SYRINGE
0.5000 mL | INJECTION | INTRAMUSCULAR | Status: AC
Start: 2023-04-13 — End: 2023-04-13
  Administered 2023-04-13: 0.5 mL via INTRAMUSCULAR

## 2023-04-13 NOTE — Discharge Instructions (Signed)

## 2023-04-13 NOTE — ED Triage Notes (Signed)
Left hand injury after a lawn mower fell on her left hand. Small abrasions to left hand.

## 2023-04-13 NOTE — ED Provider Notes (Signed)
[facility]  ED Primary Provider Note  Patient Name: Pamela Christensen  Patient Age: 77 y.o.  Date of Birth: 12-Oct-1946    Chief Complaint: Hand Injury        History of Present Illness       Pamela Christensen is a 77 y.o. female who had concerns including Hand Injury.  Patient presents to ED today for left hand injury.  Patient states that she was cleaning out from underneath her lawn mower and she had a popped up with a piece of wood.  Patient states she went to throw away some of the excess scratch that she would cleaned out from underneath her lawn mower and hit the piece of wood causing the lawn mower to come down on her left hand.  There is an abrasion noted to the left hand.  Patient's tetanus is not up-to-date.  Full range of motion.  CNS is intact.        Review of Systems     No other overt Review of Systems are noted to be positive except noted in the HPI.      Historical Data   History Reviewed This Encounter: Medical History  Surgical History  Family History  Social History      Physical Exam   ED Triage Vitals [04/13/23 2057]   BP (Non-Invasive) (!) 151/93   Heart Rate 84   Respiratory Rate (!) 99   Temperature 36.8 C (98.3 F)   SpO2 99 %   Weight    Height          Nursing notes reviewed for what could be assessed. Past Medical, Surgical, and Social history reviewed for what has been completed.     Constitutional: NAD. Well-Developed. Well Nourished.  Cardiovascular: Regular Rate and Rhythm, extremities well perfused.  Pulmonary/Chest: No respiratory distress. Lungs are symmetric to auscultation bilaterally.  Skin: Warm, dry, and intact abrasions noted to the left hand.  CNS is intact.  Capillary refills brisk.  Patient has full range of motion.  Neuro: Appropriate, CN II-XII grossly intact. Gait at patient's baseline  Psych: Pleasant           Lac Repair    Date/Time: 04/13/2023 10:12 PM    Performed by: Annell Greening, FNP-C  Authorized by: Annell Greening, FNP-C     Consent:     Consent obtained:  Verbal    Consent given by:  Patient    Risks, benefits, and alternatives were discussed: yes      Risks discussed:  Infection, need for additional repair, nerve damage, poor wound healing, poor cosmetic result, pain, retained foreign body, tendon damage and vascular damage    Alternatives discussed:  No treatment, delayed treatment, observation and referral  Universal protocol:     Imaging studies available: yes      Patient identity confirmed:  Verbally with patient and arm band  Anesthesia:     Anesthesia method:  None  Laceration details:     Location:  Hand    Hand location:  L hand, dorsum    Length (cm):  2  Pre-procedure details:     Preparation:  Patient was prepped and draped in usual sterile fashion and imaging obtained to evaluate for foreign bodies  Treatment:     Area cleansed with:  Chlorhexidine and saline    Amount of cleaning:  Standard    Irrigation solution:  Sterile saline  Skin repair:     Repair method:  Tissue adhesive  Approximation:     Approximation:  Close  Repair type:     Repair type:  Simple  Post-procedure details:     Dressing:  Open (no dressing)    Procedure completion:  Tolerated well, no immediate complications        Patient Data   Labs Ordered/Reviewed - No data to display    XR HAND LEFT 2 VIEW   Final Result by Edi, Radresults In (06/28 2143)   NO ACUTE FRACTURE OR DISLOCATION.       If acute hand or wrist trauma is suspected and initial radiographs are negative or equivocal, repeat radiographs in 10-14 days, MRI without IV contrast, or CT without IV contrast is usually appropriate as the next imaging study. (ACR Appropriateness Criteria: Acute Hand and Wrist Trauma, 2018)                Radiologist location ID: [id number]             Medical Decision Making          Medical Decision Making  Amount and/or Complexity of Data Reviewed  Radiology: ordered.    Risk  Prescription drug management.          Studies Assessed:  Imaging        MDM  Narrative:  Patient presents to ED today for left hand injury.  Patient states that she was cleaning out from underneath her lawn mower and she had a popped up with a piece of wood.  Patient states she went to throw away some of the excess scratch that she would cleaned out from underneath her lawn mower and hit the piece of wood causing the lawn mower to come down on her left hand.  There is an abrasion noted to the left hand.  Patient's tetanus is not up-to-date.  Full range of motion.  CNS is intact.  X-rays were negative for fracture  Patient's wounds were cleaned using Hibiclens.  The skin tears were fixed and the laceration was repaired with skin glue see procedure note.  Patient was discharged home and instructed to follow up with her PCP.  Patient verbalized understanding.             Medications Administered in the ED   diphtheria, pertussis-acell, tetanus (BOOSTRIX) IM injection (0.5 mL IntraMUSCULAR Given 04/13/23 2105)       Following the history, physical exam, and ED workup, the patient was deemed stable and suitable for discharge. The patient/caregiver was advised to return to the ED for any new or worsening symptoms. Discharge medications, and follow-up instructions were discussed with the patient/caregiver in detail, who verbalizes understanding. The patient/caregiver is in agreement and is comfortable with the plan of care.    Disposition: Discharged         Current Discharge Medication List        CONTINUE these medications - NO CHANGES were made during your visit.        Details   aspirin 81 mg Tablet, Chewable   81 mg, Oral, DAILY  Refills: 0     atorvastatin 20 mg Tablet  Commonly known as: LIPITOR   TAKE 1 TABLET ONCE DAILY IN THE EVENING  Qty: 90 Tablet  Refills: 1     diclofenac sodium 75 mg Tablet, Delayed Release (E.C.)  Commonly known as: VOLTAREN   TAKE (1) TABLET TWICE A DAY.  Qty: 60 Tablet  Refills: 1     ergocalciferol (vitamin D2) 1,250 mcg (  50,000 unit) Capsule  Commonly known as:  DRISDOL   50,000 Units, Oral, EVERY 7 DAYS  Qty: 13 Capsule  Refills: 1     * mupirocin 2 % Ointment  Commonly known as: BACTROBAN   Apply Topically, 2 TIMES DAILY  Qty: 22 g  Refills: 1     * mupirocin 2 % Ointment  Commonly known as: BACTROBAN   Apply Topically, 3 TIMES DAILY  Qty: 1 Each  Refills: 1     pregabalin 75 mg Capsule  Commonly known as: LYRICA   75 mg, Oral, 2 TIMES DAILY  Qty: 180 Capsule  Refills: 0     RABEprazole 20 mg Tablet, Delayed Release (E.C.)  Commonly known as: ACIPHEX   20 mg, Oral, DAILY  Refills: 0     sucralfate 1 gram Tablet  Commonly known as: CARAFATE   1 g, Oral, 2 TIMES DAILY BEFORE MEALS  Qty: 60 Tablet  Refills: 3     traMADoL 50 mg Tablet  Commonly known as: ULTRAM   50 mg, Oral, 3 TIMES DAILY PRN  Qty: 90 Tablet  Refills: 1           * This list has 2 medication(s) that are the same as other medications prescribed for you. Read the directions carefully, and ask your doctor or other care provider to review them with you.                Follow up:   No follow-up provider specified.             Clinical Impression   Laceration of left hand without foreign body, initial encounter (Primary)   Tetanus toxoid vaccination administered at current visit         Current Discharge Medication List              Bunnie Domino FNP-C  Department of Emergency Medicine

## 2023-05-27 ENCOUNTER — Encounter (HOSPITAL_COMMUNITY): Payer: Self-pay | Admitting: Family

## 2023-05-27 ENCOUNTER — Emergency Department (HOSPITAL_COMMUNITY): Payer: Medicare Other

## 2023-05-27 ENCOUNTER — Other Ambulatory Visit: Payer: Self-pay

## 2023-05-27 ENCOUNTER — Emergency Department
Admission: EM | Admit: 2023-05-27 | Discharge: 2023-05-27 | Disposition: A | Discharge status: Home / Self Care | Payer: Medicare Other | Attending: Family | Admitting: Family

## 2023-05-27 DIAGNOSIS — F17210 Nicotine dependence, cigarettes, uncomplicated: Secondary | ICD-10-CM

## 2023-05-27 DIAGNOSIS — U071 COVID-19: Secondary | ICD-10-CM | POA: Insufficient documentation

## 2023-05-27 DIAGNOSIS — J449 Chronic obstructive pulmonary disease, unspecified: Secondary | ICD-10-CM | POA: Insufficient documentation

## 2023-05-27 LAB — CBC WITH DIFF
BASOPHIL #: 0 10*3/uL (ref 0.00–0.10)
BASOPHIL %: 1 % (ref 0–1)
EOSINOPHIL #: 0 10*3/uL (ref 0.00–0.50)
EOSINOPHIL %: 0 % — ABNORMAL LOW (ref 1–7)
HCT: 39.5 % (ref 31.2–41.9)
HGB: 13.1 g/dL (ref 10.9–14.3)
LYMPHOCYTE #: 0.7 10*3/uL — ABNORMAL LOW (ref 1.00–3.00)
LYMPHOCYTE %: 11 % — ABNORMAL LOW (ref 16–44)
MCH: 28.7 pg (ref 24.7–32.8)
MCHC: 33.1 g/dL (ref 32.3–35.6)
MCV: 86.7 fL (ref 75.5–95.3)
MONOCYTE #: 1 10*3/uL (ref 0.30–1.00)
MONOCYTE %: 16 % — ABNORMAL HIGH (ref 5–13)
MPV: 10.4 fL (ref 7.9–10.8)
NEUTROPHIL #: 4.4 10*3/uL (ref 1.85–7.80)
NEUTROPHIL %: 72 % (ref 43–77)
PLATELETS: 164 10*3/uL (ref 140–440)
RBC: 4.55 10*6/uL (ref 3.63–4.92)
RDW: 16.7 % (ref 12.3–17.7)
WBC: 6.1 10*3/uL (ref 3.8–11.8)

## 2023-05-27 LAB — COMPREHENSIVE METABOLIC PANEL, NON-FASTING
ALBUMIN/GLOBULIN RATIO: 1.3 (ref 0.8–1.4)
ALBUMIN: 4.2 g/dL (ref 3.5–5.7)
ALKALINE PHOSPHATASE: 82 U/L (ref 34–104)
ALT (SGPT): 20 U/L (ref 7–52)
ANION GAP: 14 mmol/L — ABNORMAL HIGH (ref 4–13)
AST (SGOT): 22 U/L (ref 13–39)
BILIRUBIN TOTAL: 0.5 mg/dL (ref 0.3–1.0)
BUN/CREA RATIO: 16 (ref 6–22)
BUN: 12 mg/dL (ref 7–25)
CALCIUM, CORRECTED: 9.1 mg/dL (ref 8.9–10.8)
CALCIUM: 9.3 mg/dL (ref 8.6–10.3)
CHLORIDE: 104 mmol/L (ref 98–107)
CO2 TOTAL: 20 mmol/L — ABNORMAL LOW (ref 21–31)
CREATININE: 0.73 mg/dL (ref 0.60–1.30)
ESTIMATED GFR: 86 mL/min/{1.73_m2} (ref >59)
GLOBULIN: 3.2 (ref 2.9–5.4)
GLUCOSE: 106 mg/dL (ref 74–109)
OSMOLALITY, CALCULATED: 276 mOsm/kg (ref 270–290)
POTASSIUM: 3.5 mmol/L (ref 3.5–5.1)
PROTEIN TOTAL: 7.4 g/dL (ref 6.4–8.9)
SODIUM: 138 mmol/L (ref 136–145)

## 2023-05-27 LAB — COVID-19, FLU A/B, RSV RAPID BY PCR
INFLUENZA VIRUS TYPE A: NOT DETECTED
INFLUENZA VIRUS TYPE B: NOT DETECTED
RESPIRATORY SYNCTIAL VIRUS (RSV): NOT DETECTED
SARS-CoV-2: DETECTED — AB

## 2023-05-27 LAB — LACTIC ACID LEVEL W/ REFLEX FOR LEVEL >2.0: LACTIC ACID: 0.9 mmol/L (ref 0.5–2.2)

## 2023-05-27 LAB — RAPID THROAT SCREEN, STREPTOCOCCUS, WITH REFLEX: THROAT RAPID SCREEN, STREPTOCOCCUS: NEGATIVE

## 2023-05-27 MED ORDER — METHYLPREDNISOLONE SOD SUCC 125 MG SOLUTION FOR INJECTION WRAPPER
INTRAVENOUS | Status: AC
Start: 2023-05-27 — End: 2023-05-27
  Filled 2023-05-27: qty 2

## 2023-05-27 MED ORDER — IPRATROPIUM 0.5 MG-ALBUTEROL 3 MG (2.5 MG BASE)/3 ML NEBULIZATION SOLN
3.0000 mL | INHALATION_SOLUTION | Freq: Four times a day (QID) | RESPIRATORY_TRACT | 0 refills | Status: AC
Start: 2023-05-27 — End: 2023-06-03

## 2023-05-27 MED ORDER — PREDNISONE 50 MG TABLET
50.0000 mg | ORAL_TABLET | Freq: Every day | ORAL | 0 refills | Status: AC
Start: 2023-05-27 — End: 2023-06-01

## 2023-05-27 MED ORDER — IPRATROPIUM 0.5 MG-ALBUTEROL 3 MG (2.5 MG BASE)/3 ML NEBULIZATION SOLN
3.0000 mL | INHALATION_SOLUTION | RESPIRATORY_TRACT | Status: AC
Start: 2023-05-27 — End: 2023-05-27
  Administered 2023-05-27: 3 mL via RESPIRATORY_TRACT

## 2023-05-27 MED ORDER — METHYLPREDNISOLONE SOD SUCC 125 MG SOLUTION FOR INJECTION WRAPPER
125.0000 mg | INTRAVENOUS | Status: AC
Start: 2023-05-27 — End: 2023-05-27
  Administered 2023-05-27: 125 mg via INTRAMUSCULAR

## 2023-05-27 NOTE — ED Nurses Note (Addendum)
Discharge instructions were given to the patient. All the questions were answered. The patient was given a mask and was able to ambulate out of the emergency department

## 2023-05-27 NOTE — ED Triage Notes (Signed)
Patient c/o chills, cough, and body aches. Patient wants to "see if she has pneumonia."

## 2023-05-27 NOTE — Respiratory Therapy (Signed)
05/27/23 1610   Respiratory   Respiratory WDL WDL   Additional Documentation Aerosol Therapy Group   Breath Sounds   Throughout All Lung Fields All Fields   All Lung Fields Breath Sounds coarse;wheezes, expiratory    Aerosol Therapy   $ Respiratory Treatment (Resp only) Neb (Initial)      Medications DuoNeb (albuterol-atrovent)   Start Time 1610   Stop Time 1625   Duration (minutes) 15 minutes   Treatment Status Given   Route (Aerosol Therapy) mask   Signs of Intolerance (SVN) none   Patient Position (SVN) HOB at 45 degrees   Respiratory Pre/Post-Treatment Assess   SpO2 99 %   Pre-Treatment Heart Rate (beats/min) 95   Pre-Treatment Resp Rate (breaths/min) 20   Post-treatment Heart Rate (beats/min) 96   Post-treatment Resp Rate (breaths/min) 20    RT Oxygen Noting/Safety checks   Oxygen Noting/Safety Checks Oxygen Flowmeter Checked and Functioning Properly   Oxygen Therapy   O2 Delivery Source  RA

## 2023-05-27 NOTE — ED Nurses Note (Signed)
The patient arrived to room 28 from triage. The patient stated that yesterday she started to not feel good. The patient stated that she is having body aches. The patient stated that she is worried that she may have pneumonia. The patient is alert and oriented x4.

## 2023-05-27 NOTE — ED Provider Notes (Signed)
[facility]  ED Primary Provider Note  History of Present Illness   Chief Complaint   Patient presents with    Flu Like Symptoms     Pamela Christensen is a 77 y.o. female who had concerns including Flu Like Symptoms.  Arrival: The patient arrived by Car    Patient is a 77 year old female to the emergency department complaining of cough, chills, myalgias, that has been ongoing for the past 3-4 days.  Patient states history of chronic obstructive pulmonary disease with associated pneumonia.  Patient states she is to the emergency department for chest x-ray to ensure she does not pneumonia.  Patient states she has been around others with similar symptoms at home.  Patient denies fever, shortness of breath or chest pain.  Patient denies hemoptysis, unilateral leg swelling or prolonged immobilization.      History Reviewed This Encounter: Medical History  Surgical History  Family History  Social History    Physical Exam   ED Triage Vitals [05/27/23 1602]   BP (Non-Invasive) 119/75   Heart Rate (!) 106   Respiratory Rate 20   Temperature 36.4 C (97.6 F)   SpO2 98 %   Weight 73.5 kg (162 lb)   Height 1.575 m (5\' 2" )     Physical Exam  Vitals and nursing note reviewed.   Constitutional:       General: She is not in acute distress.     Appearance: She is well-developed.   HENT:      Head: Normocephalic and atraumatic.   Eyes:      Conjunctiva/sclera: Conjunctivae normal.   Cardiovascular:      Rate and Rhythm: Normal rate and regular rhythm.      Heart sounds: No murmur heard.  Pulmonary:      Effort: Pulmonary effort is normal. No respiratory distress.      Breath sounds: Normal breath sounds.   Abdominal:      Palpations: Abdomen is soft.      Tenderness: There is no abdominal tenderness.   Musculoskeletal:         General: No swelling.      Cervical back: Neck supple.   Skin:     General: Skin is warm and dry.      Capillary Refill: Capillary refill takes less than 2 seconds.    Neurological:      Mental Status: She is alert.   Psychiatric:         Mood and Affect: Mood normal.       Patient Data     Labs Ordered/Reviewed   COVID-19, FLU A/B, RSV RAPID BY PCR - Abnormal; Notable for the following components:       Result Value    SARS-CoV-2 Detected (*)     All other components within normal limits    Narrative:     Results are for the simultaneous qualitative identification of SARS-CoV-2 (formerly 2019-nCoV), Influenza A, Influenza B, and RSV RNA. These etiologic agents are generally detectable in nasopharyngeal and nasal swabs during the ACUTE PHASE of infection. Hence, this test is intended to be performed on respiratory specimens collected from individuals with signs and symptoms of upper respiratory tract infection who meet Centers for Disease Control and Prevention (CDC) clinical and/or epidemiological criteria for Coronavirus Disease 2019 (COVID-19) testing. CDC COVID-19 criteria for testing on human specimens is available at [facility] webpage information for Healthcare Professionals: Coronavirus Disease 2019 (COVID-19) ([email or web address]).     False-negative results may  occur if the virus has genomic mutations, insertions, deletions, or rearrangements or if performed very early in the course of illness. Otherwise, negative results indicate virus specific RNA targets are not detected, however negative results do not preclude SARS-CoV-2 infection/COVID-19, Influenza, or Respiratory syncytial virus infection. Results should not be used as the sole basis for patient management decisions. Negative results must be combined with clinical observations, patient history, and epidemiological information. If upper respiratory tract infection is still suspected based on exposure history together with other clinical findings, re-testing should be considered.    Disclaimer:   This assay has been authorized by FDA under an Emergency Use Authorization for use in  laboratories certified under the Clinical Laboratory Improvement Amendments of 1988 (CLIA), 42 U.S.C. [phone], to perform high complexity tests. The impacts of vaccines, antiviral therapeutics, antibiotics, chemotherapeutic or immunosuppressant drugs have not been evaluated.     Test methodology:   Cepheid Xpert Xpress SARS-CoV-2/Flu/RSV Assay real-time polymerase chain reaction (RT-PCR) test on the GeneXpert Dx and Xpert Xpress systems.   COMPREHENSIVE METABOLIC PANEL, NON-FASTING - Abnormal; Notable for the following components:    CO2 TOTAL 20 (*)     ANION GAP 14 (*)     All other components within normal limits    Narrative:     Estimated Glomerular Filtration Rate (eGFR) is calculated using the CKD-EPI (2021) equation, intended for patients 75 years of age and older. If gender is not documented or "unknown", there will be no eGFR calculation.     CBC WITH DIFF - Abnormal; Notable for the following components:    LYMPHOCYTE % 11 (*)     MONOCYTE % 16 (*)     EOSINOPHIL % 0 (*)     LYMPHOCYTE # 0.70 (*)     All other components within normal limits   RAPID THROAT SCREEN, STREPTOCOCCUS, WITH REFLEX - Normal    Narrative:     Walk-Away Mode   LACTIC ACID LEVEL W/ REFLEX FOR LEVEL >2.0 - Normal   THROAT CULTURE, BETA HEMOLYTIC STREPTOCOCCUS   ADULT ROUTINE BLOOD CULTURE, SET OF 2 BOTTLES (BACTERIA AND YEAST)   ADULT ROUTINE BLOOD CULTURE, SET OF 2 BOTTLES (BACTERIA AND YEAST)   CBC/DIFF    Narrative:     The following orders were created for panel order CBC/DIFF.  Procedure                               Abnormality         Status                     ---------                               -----------         ------                     CBC WITH DIFF[[id number]]                Abnormal            Final result                 Please view results for these tests on the individual orders.   URINALYSIS, MACROSCOPIC AND MICROSCOPIC W/CULTURE REFLEX    Narrative:     The following orders were created for panel  order  URINALYSIS, MACROSCOPIC AND MICROSCOPIC W/CULTURE REFLEX.  Procedure                               Abnormality         Status                     ---------                               -----------         ------                     URINALYSIS, MACROSCOPIC[[id number]]                                                     URINALYSIS, MICROSCOPIC[[id number]]                                                       Please view results for these tests on the individual orders.   URINALYSIS, MACROSCOPIC   URINALYSIS, MICROSCOPIC     XR CHEST PA AND LATERAL   Final Result by Edi, Radresults In (08/11 1639)   NO ACUTE FINDINGS.         Radiologist location ID: [id number]           Medical Decision Making        Medical Decision Making  Patient is a 77 year old female to the emergency department complaining of cough, chills, myalgias, that has been ongoing for the past 3-4 days.  Patient states history of chronic obstructive pulmonary disease with associated pneumonia.  Patient states she is to the emergency department for chest x-ray to ensure she does not pneumonia.  Patient states she has been around others with similar symptoms at home.  Patient denies fever, shortness of breath or chest pain.  Patient denies hemoptysis, unilateral leg swelling or prolonged immobilization.  Physical examination patient has diffuse wheezing scattered throughout lung fields.  Patient has nonproductive cough noted.  Patient does have history of chronic obstructive pulmonary disease but denies wearing oxygen at home.  Patient denies use of albuterol or DuoNebs at home prior to arrival.  Chest x-ray ordered for evaluation within normal limits.  COVID flu RSV swab returned positive for COVID-19.  Pertinent lab findings noted.  Patient has no lower extremity edema.  Patient is discharged with DuoNebs and steroids to use at home for symptoms, alternate Motrin and Tylenol as needed, and follow up with primary care provider.    Risk  Prescription drug  management.      ED Course as of 05/27/23 1743   Sun May 27, 2023   1628 MAP (Non-Invasive): 87 mmHG            Medications Administered in the ED   ipratropium-albuterol 0.5 mg-3 mg(2.5 mg base)/3 mL Solution for Nebulization (3 mL Nebulization Given 05/27/23 1610)   methylPREDNISolone sod succ (SOLU-medrol) 125 mg/2 mL injection (125 mg IntraMUSCULAR Given 05/27/23 1711)     Clinical Impression   COVID (  Primary)   Chronic obstructive pulmonary disease, unspecified COPD type (CMS HCC)       Disposition: Discharged

## 2023-05-27 NOTE — ED Nurses Note (Signed)
The patient stated that she smokes and has COPD. The patient stated that she is unsure what her oxygen usually runs. The provider is aware that the oxygen is staying at about 89% on room air. The provider stated to make sure her oxygen levels stay above 89%

## 2023-05-27 NOTE — ED APP Handoff Note (Signed)
[facility]  Emergency Department  Provider in Triage Note    Name: Pamela Christensen  Age: 77 y.o.  Gender: female     Subjective:   Pamela Christensen is a 77 y.o. female who presents with complaint of Flu Like Symptoms  .  Pt reports cough, body aches and chills. She has a h/o COPD and does smoke.     Objective:   Filed Vitals:    05/27/23 1602   BP: 119/75   Pulse: (!) 106   Resp: 20   Temp: 36.4 C (97.6 F)   SpO2: 98%      Focused Physical Exam shows right sided expiratory wheezes.     Assessment:  A medical screening exam was completed.  This patient is a 77 y.o. female with initial findings showing flu like symptoms, COPD    Plan:  Please see initial orders and work-up below.  This is to be continued with full evaluation in the main Emergency Department.     ipratropium-albuterol 0.5 mg-3 mg(2.5 mg base)/3 mL Solution for Nebulization, 3 mL, Nebulization, Now       Results for orders placed or performed during the hospital encounter of 05/27/23 (from the past 24 hour(s))   URINALYSIS, MACROSCOPIC AND MICROSCOPIC W/CULTURE REFLEX    Specimen: Urine, Clean Catch    Narrative    The following orders were created for panel order URINALYSIS, MACROSCOPIC AND MICROSCOPIC W/CULTURE REFLEX.  Procedure                               Abnormality         Status                     ---------                               -----------         ------                     URINALYSIS, MACROSCOPIC[[id number]]                                                     URINALYSIS, MICROSCOPIC[[id number]]                                                       Please view results for these tests on the individual orders.        [place], PA-C  05/27/2023, 16:07

## 2023-05-29 LAB — THROAT CULTURE, BETA HEMOLYTIC STREPTOCOCCUS: THROAT CULTURE: NORMAL

## 2023-06-01 LAB — ADULT ROUTINE BLOOD CULTURE, SET OF 2 BOTTLES (BACTERIA AND YEAST)
BLOOD CULTURE, ROUTINE: NO GROWTH
BLOOD CULTURE, ROUTINE: NO GROWTH

## 2023-06-02 ENCOUNTER — Ambulatory Visit (HOSPITAL_COMMUNITY): Payer: Self-pay | Admitting: PHYSICIAN ASSISTANT

## 2023-06-02 NOTE — Telephone Encounter (Signed)
Reason for Disposition   1] COVID-19 infection diagnosed or suspected AND [2] mild symptoms (fever, cough) AND [2] no trouble breathing or other complications    Additional Information   Negative: SEVERE difficulty breathing (e.g., struggling for each breath, speaks in single words)   Negative: Difficult to awaken or acting confused (e.g., disoriented, slurred speech)   Negative: Bluish (or gray) lips or face now   Negative: Shock suspected (e.g., cold/pale/clammy skin, too weak to stand, low BP, rapid pulse)   Negative: Sounds like a life-threatening emergency to the triager   Negative: [1] COVID-19 suspected (e.g., cough, fever, shortness of breath) AND [2] public health department recommends testing   Negative: [1] COVID-19 exposure AND [2] no symptoms   Negative: COVID-19 and Breastfeeding, questions about   Negative: SEVERE or constant chest pain (Exception: mild central chest pain, present only when coughing)   Negative: MODERATE difficulty breathing (e.g., speaks in phrases, SOB even at rest, pulse 100-120)   Negative: Patient sounds very sick or weak to the triager   Negative: MILD difficulty breathing (e.g., minimal/no SOB at rest, SOB with walking, pulse <100)   Negative: Chest pain   Negative: Fever > 103 F (39.4 C)   Negative: [1] Fever > 101 F (38.3 C) AND [2] age > 63   Negative: [1] Fever > 100.0 F (37.8 C) AND [2] bedridden (e.g., nursing home patient, CVA, chronic illness, recovering from surgery)   Negative: HIGH RISK patient (e.g., age > 64 years, diabetes, heart or lung disease, weak immune system)   Negative: Fever present > 3 days (72 hours)   Negative: [1] Fever returns after gone for over 24 hours AND [2] symptoms worse or not improved   Negative: [1] Continuous (nonstop) coughing interferes with work or school AND [2] no improvement using cough treatment per protocol   Negative: Cough present > 3 weeks    Protocols used: Coronavirus (COVID-19) Diagnosed or Suspected-ADULT-AH  NN received  call from patient.  Pt states she was seen in the ED on 05/27/23 and was diagnosed with COVID.  Pt reports she's still having chills but states all other symptoms are improving.  Pt denies SOB.  Pt is unsure if she has a fever because she didn't check her temp before calling.  NN advised that it's not unusual for pt's to still have symptoms on day 6.  NN advised on home care and comfort measures as per protocol.  Pt states she has one dose of prednisone left and is taking neb treatments as instructed.  Advised to continue monitoring and if any new, concerning or worsening symptoms to contact NN line back.  Pt verbalized understanding and agrees with plan.

## 2023-06-20 NOTE — Progress Notes (Signed)
 Vascular Clinical Note     Visit Date:  06/20/2023     Patient: Pamela Christensen    DOB:  August 14, 1946  Age: 77 y.o.   Gender: female   Phone Number: [phone]         Patient was referred by: Debby JONETTA Ship   Patient Provider(s): Darra Layman BROCKS     Chief Complaint:   Chief Complaint   Patient presents with   . Follow-up     Carotid artery duplex        History of the Present Illness:  Pt presents today for 1 year follow-up for carotid artery stenosis.    No history of carotid surgery.    Pt denies any new neurological symptoms including monocular blindness, amaurosis fugax, unilateral weakness, aphasia, or speech changes.      Approximate 1 month ago patient got diagnosed with COVID and has been dealing with some lingering symptoms for approximately a month.  She reports having a film at the center of her vision in the right eye that has occurred 3 times recently.  This film is not covering her entire vision however is just in the center of her vision.  This was accompanied by some lightheadedness and dizziness.  She denies any monocular blindness or any sensation of a shade coming over the entire visual field on the right side.    Patient also reports chronic abdominal pain however denies any diarrhea or constipation.  This pain is random in occurrence and occurs both after eating and not after eating and does not appear to be strictly postprandial.  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      Pt does smoke.      Pt takes aspirin and statin.     MEDICATIONS PRIOR TO VISIT:  Outpatient Encounter Medications as of 06/20/2023   Medication Sig Dispense Refill   . predniSONE  (DELTASONE ) 10 mg Tablet Take 2 tablets every day by oral route as directed for 5 days.     SABRA levoFLOXacin (LEVAQUIN) 500 mg Tablet      . albuterol  (PROVENTIL  HFA) 90 mcg/actuation HFA Aerosol Inhaler      . aspirin 81 mg Tablet, Chewable take 81 mg by mouth .     . atorvastatin  (LIPITOR)  20 mg Tablet      . cefdinir (OMNICEF) 300 mg Capsule Take 1 capsule every 12 hours by oral route as directed for 10 days.     . diclofenac EC (VOLTAREN) 75 mg Tablet, Delayed Release (E.C.)      . ergocalciferol (ERGOCALCIFEROL) 1,250 mcg (50,000 unit) Capsule      . gabapentin (NEURONTIN) 100 mg Capsule      . pregabalin  (LYRICA ) 75 mg Capsule      . promethazine (PHENERGAN) 25 mg Tablet take 25 mg by mouth .     . tramadol  (ULTRAM ) 50 mg Tablet        No facility-administered encounter medications on file as of 06/20/2023.          PATIENT HISTORY:  Past Medical History:   Diagnosis Date   . Breast lump    . Carotid artery disorder (HCC)    . Cataracts, bilateral    . COPD (chronic obstructive pulmonary disease) (HCC)    . Essential hypertension    . GERD (gastroesophageal reflux disease)    . Mixed hyperlipidemia    . PVD (peripheral vascular disease) (HCC)  SURGICAL HISTORY:  Last Surgery:  No surgery found  No surgery found       Past Surgical History:   Procedure Laterality Date   . HX APPENDECTOMY     . HX BREAST LUMPECTOMY     . LITHOTRIPSY     . TONSILLECTOMY          DRUG ALLERGIES:  Allergies   Allergen Reactions   . Bee Pollen             FAMILY HISTORY:  Family History   Problem Relation Name Age of Onset   . Chronic Obstructive Pulmonary Disease Mother     . Parkinsonism Mother     . Stroke Father     . Hypertension Father     . Cancer Father           SOCIAL HISTORY:  Social History     Tobacco Use   . Smoking status: Every Day     Types: Cigarettes   . Smokeless tobacco: Never   Vaping Use   . Vaping Use: Never used   Substance Use Topics   . Alcohol use: Not Currently   . Drug use: Never        REVIEW OF SYSTEMS:  Review of Systems   Constitutional:  Negative for chills, fever, malaise/fatigue and weight loss.   Eyes:  Positive for blurred vision (gray dot at the center of her vision intermittent).        Negative for monocular blindness or amaurosis fugax    Neurological:  Negative for  dizziness, tingling, sensory change, speech change, focal weakness, loss of consciousness and weakness.        VITALS:  BP (!) 155/60 (BP Location: Left arm)   Ht 1.575 m (5' 2)   Wt 70.3 kg (155 lb)   BMI 28.35 kg/m          PHYSICAL EXAM:  General Appearance: No acute distress, awake alert and oriented x3.  HEENT: Normocephalic, atraumatic, pupils are equal, round, and reactive.  Neck is supple, trachea is midline.   Cardiovascular: Normal rate and rhythm. Upper and lower extremities are warm and well-perfused.   Neurologic: Patient is oriented x3, normal motor and sensory throughout.    Integument: No skin lesions ulcers or rashes.  Musculoskeletal: Normal gait, no joint effusions visualized, normal range of motion in upper and lower extremities.         Prelim Imaging: Carotid duplex performed and reviewed today   CAROTID/VERTEBRAL DUPLEX 06/20/2023 (Preliminary)  This result has not been signed. Information might be incomplete.    Narrative  CAROTID DUPLEX  BILATERAL EXAM    Pt. Name: Pamela Christensen  PT. MPI: [id number]  Pt. MRN: [id number]  DOB: 1946/07/25  Sex: F  Date of Exam: 06/20/2023  Technologist: Dodson Saba RVT  Referring Physician: Debby Ship  Accession Number: [id number]  Facility: [facility] Accredited Lab    Ordering Indication: 06/14/22: BICA normal      Impression:  1. This study demonstrates bilateral internal carotid artery stenosis less than 50%.    Right BP: 140/60  Left BP: 155/60      Using grayscale, color Doppler, and Doppler spectral analysis the cerebrovascular exam was performed.    Right Carotid:  Imaging of the right carotid system demonstrates normal caliber vessels with mild plaque deposition identified. Color flow imaging does not demonstrate turbulent flow. Transverse imaging suggests stenosis in the ICA. There is evidence of a  minimal amount of calcified plaque seen in the distal CCA. There is evidence of a minimal amount of calcified plaque  seen in the proximal ICA. Right vertebral artery flow is antegrade. Right subclavian artery flow is multiphasic.      Left Carotid:  Imaging of the left carotid system demonstrates normal caliber vessels with mild plaque deposition identified. Color flow imaging does not demonstrate turbulent flow. Transverse imaging does not suggest stenosis. There is evidence of a minimal amount of heterogeneous plaque seen in the distal CCA. There is evidence of a minimal amount of heterogeneous plaque seen in the proximal ICA. Left vertebral artery flow is antegrade. Left subclavian artery flow is multiphasic.      RIGHT  CCA P---84/17 cm/s  CCA D---71/13 cm/s  ICA P---128/33 cm/s  ICA M---113/31 cm/s  ICA D---94/33 cm/s  ECA---104/ cm/s  Vert A---49/13 cm/s  Subcl A---171/ cm/s  ICA/CCA Ratio---1.80    LEFT  CCA P---93/24 cm/s  CCA D---91/23 cm/s  ICA P---142/41 cm/s  ICA M---174/39 cm/s  ICA D---146/36 cm/s  ECA---118/ cm/s  Vert A---88/20 cm/s  Subcl A---156/ cm/s  ICA/CCA Ratio---1.91      ASSESSMENT:    ICD-10-CM    1. Bilateral carotid artery stenosis  I65.23              PLAN:                                                                              Patient presents today for 1 year evaluation of carotid artery stenosis.     No history of carotid surgery.    Neuro intact on exam today. No new neurological symptoms including monocular blindness, amaurosis fugax, unilateral weakness, aphasia, or slurred speech.     In the setting of patient not feeling well and being lightheaded during these episodes of vision change discussed with patient that this is less concerning for a stroke like symptoms.  Discussed concerning symptoms such as monocular blindness or a shade covering the entire visual field.    Duplex performed today notable for right ICA with <50% stenosis and left ICA with <50% stenosis.     Pt to follow-up in 1 year for repeat carotid duplex.     Discussed smoking cessation again today.  She will continue on  aspirin and statin.     Sarah E Eline, PA-C 06/22/2023 4:39 PM       Voice recognition may have been used for the dictation of this note and related grammatical errors or word substitutions may be present.

## 2023-07-08 ENCOUNTER — Encounter (HOSPITAL_COMMUNITY): Payer: Self-pay

## 2023-07-08 ENCOUNTER — Ambulatory Visit (HOSPITAL_COMMUNITY): Payer: Self-pay | Admitting: PHYSICIAN ASSISTANT

## 2023-07-08 ENCOUNTER — Emergency Department (HOSPITAL_COMMUNITY): Payer: Medicare Other

## 2023-07-08 ENCOUNTER — Emergency Department
Admission: EM | Admit: 2023-07-08 | Discharge: 2023-07-08 | Disposition: A | Discharge status: Home / Self Care | Payer: Medicare Other | Attending: Emergency Medicine | Admitting: Emergency Medicine

## 2023-07-08 ENCOUNTER — Other Ambulatory Visit: Payer: Self-pay

## 2023-07-08 DIAGNOSIS — I4510 Unspecified right bundle-branch block: Secondary | ICD-10-CM | POA: Insufficient documentation

## 2023-07-08 DIAGNOSIS — R110 Nausea: Secondary | ICD-10-CM | POA: Insufficient documentation

## 2023-07-08 DIAGNOSIS — R1013 Epigastric pain: Secondary | ICD-10-CM | POA: Insufficient documentation

## 2023-07-08 DIAGNOSIS — F17210 Nicotine dependence, cigarettes, uncomplicated: Secondary | ICD-10-CM

## 2023-07-08 DIAGNOSIS — R1010 Upper abdominal pain, unspecified: Secondary | ICD-10-CM

## 2023-07-08 DIAGNOSIS — R109 Unspecified abdominal pain: Secondary | ICD-10-CM

## 2023-07-08 DIAGNOSIS — R9431 Abnormal electrocardiogram [ECG] [EKG]: Secondary | ICD-10-CM | POA: Insufficient documentation

## 2023-07-08 LAB — CBC WITH DIFF
BASOPHIL #: 0.1 10*3/uL (ref 0.00–0.10)
BASOPHIL %: 1 % (ref 0–1)
EOSINOPHIL #: 0.2 10*3/uL (ref 0.00–0.50)
EOSINOPHIL %: 2 % (ref 1–7)
HCT: 37.6 % (ref 31.2–41.9)
HGB: 12.6 g/dL (ref 10.9–14.3)
LYMPHOCYTE #: 1.6 10*3/uL (ref 1.00–3.00)
LYMPHOCYTE %: 14 % — ABNORMAL LOW (ref 16–44)
MCH: 28.8 pg (ref 24.7–32.8)
MCHC: 33.4 g/dL (ref 32.3–35.6)
MCV: 86.3 fL (ref 75.5–95.3)
MONOCYTE #: 0.8 10*3/uL (ref 0.30–1.00)
MONOCYTE %: 7 % (ref 5–13)
MPV: 9.1 fL (ref 7.9–10.8)
NEUTROPHIL #: 8.8 10*3/uL — ABNORMAL HIGH (ref 1.85–7.80)
NEUTROPHIL %: 77 % (ref 43–77)
PLATELETS: 218 10*3/uL (ref 140–440)
RBC: 4.35 10*6/uL (ref 3.63–4.92)
RDW: 17.6 % (ref 12.3–17.7)
WBC: 11.4 10*3/uL (ref 3.8–11.8)

## 2023-07-08 LAB — COMPREHENSIVE METABOLIC PANEL, NON-FASTING
ALBUMIN/GLOBULIN RATIO: 1.3 (ref 0.8–1.4)
ALBUMIN: 4 g/dL (ref 3.5–5.7)
ALKALINE PHOSPHATASE: 63 U/L (ref 34–104)
ALT (SGPT): 13 U/L (ref 7–52)
ANION GAP: 11 mmol/L (ref 4–13)
AST (SGOT): 14 U/L (ref 13–39)
BILIRUBIN TOTAL: 0.4 mg/dL (ref 0.3–1.0)
BUN/CREA RATIO: 26 — ABNORMAL HIGH (ref 6–22)
BUN: 17 mg/dL (ref 7–25)
CALCIUM, CORRECTED: 9.5 mg/dL (ref 8.9–10.8)
CALCIUM: 9.5 mg/dL (ref 8.6–10.3)
CHLORIDE: 105 mmol/L (ref 98–107)
CO2 TOTAL: 23 mmol/L (ref 21–31)
CREATININE: 0.65 mg/dL (ref 0.60–1.30)
ESTIMATED GFR: 92 mL/min/{1.73_m2} (ref >59)
GLOBULIN: 3 (ref 2.9–5.4)
GLUCOSE: 118 mg/dL — ABNORMAL HIGH (ref 74–109)
OSMOLALITY, CALCULATED: 280 mOsm/kg (ref 270–290)
POTASSIUM: 3.5 mmol/L (ref 3.5–5.1)
PROTEIN TOTAL: 7 g/dL (ref 6.4–8.9)
SODIUM: 139 mmol/L (ref 136–145)

## 2023-07-08 LAB — LIPASE: LIPASE: 33 U/L (ref 11–82)

## 2023-07-08 LAB — LACTIC ACID LEVEL W/ REFLEX FOR LEVEL >2.0: LACTIC ACID: 0.6 mmol/L (ref 0.5–2.2)

## 2023-07-08 LAB — TROPONIN-I: TROPONIN I: 8 ng/L (ref <15)

## 2023-07-08 MED ORDER — PANTOPRAZOLE 40 MG INTRAVENOUS SOLUTION
INTRAVENOUS | Status: AC
Start: 2023-07-08 — End: 2023-07-08
  Filled 2023-07-08: qty 10

## 2023-07-08 MED ORDER — LIDOCAINE HCL 2 % MUCOSAL SOLUTION
Status: AC
Start: 2023-07-08 — End: 2023-07-08
  Filled 2023-07-08: qty 15

## 2023-07-08 MED ORDER — LIDOCAINE HCL 2 % MUCOSAL SOLUTION
15.0000 mL | Freq: Once | Status: AC
Start: 2023-07-08 — End: 2023-07-08
  Administered 2023-07-08: 15 mL via ORAL

## 2023-07-08 MED ORDER — ONDANSETRON 8 MG DISINTEGRATING TABLET
8.0000 mg | ORAL_TABLET | Freq: Four times a day (QID) | ORAL | 0 refills | Status: AC | PRN
Start: 2023-07-08 — End: ?

## 2023-07-08 MED ORDER — ALUMINUM-MAG HYDROXIDE-SIMETHICONE 200 MG-200 MG-20 MG/5 ML ORAL SUSP
30.0000 mL | Freq: Once | ORAL | Status: AC
Start: 2023-07-08 — End: 2023-07-08
  Administered 2023-07-08: 30 mL via ORAL

## 2023-07-08 MED ORDER — HYOSCYAMINE 0.125 MG/5 ML ORAL ELIXIR
10.0000 mL | ORAL_SOLUTION | Freq: Once | ORAL | Status: AC
Start: 2023-07-08 — End: 2023-07-08
  Administered 2023-07-08: 10 mL via ORAL

## 2023-07-08 MED ORDER — PANTOPRAZOLE 40 MG TABLET,DELAYED RELEASE
40.0000 mg | DELAYED_RELEASE_TABLET | Freq: Every day | ORAL | 0 refills | Status: AC
Start: 2023-07-08 — End: 2023-08-07

## 2023-07-08 MED ORDER — HYOSCYAMINE 0.125 MG/5 ML ORAL ELIXIR
ORAL_SOLUTION | ORAL | Status: AC
Start: 2023-07-08 — End: 2023-07-08
  Filled 2023-07-08: qty 5

## 2023-07-08 MED ORDER — PANTOPRAZOLE 40 MG INTRAVENOUS SOLUTION
40.0000 mg | INTRAVENOUS | Status: AC
Start: 2023-07-08 — End: 2023-07-08
  Administered 2023-07-08: 40 mg via INTRAVENOUS

## 2023-07-08 MED ORDER — IOHEXOL 350 MG IODINE/ML INTRAVENOUS SOLUTION
100.0000 mL | INTRAVENOUS | Status: AC
Start: 2023-07-08 — End: 2023-07-08
  Administered 2023-07-08: 75 mL via INTRAVENOUS

## 2023-07-08 MED ORDER — ALUMINUM-MAG HYDROXIDE-SIMETHICONE 200 MG-200 MG-20 MG/5 ML ORAL SUSP
ORAL | Status: AC
Start: 2023-07-08 — End: 2023-07-08
  Filled 2023-07-08: qty 30

## 2023-07-08 MED ORDER — SODIUM CHLORIDE 0.9 % IV BOLUS
1000.0000 mL | INJECTION | Status: AC
Start: 2023-07-08 — End: 2023-07-08
  Administered 2023-07-08: 0 mL via INTRAVENOUS
  Administered 2023-07-08: 1000 mL via INTRAVENOUS

## 2023-07-08 NOTE — Consults (Signed)
[facility]  General Surgery  Consultation    Date of Service:  07/08/2023  Esli, Klinger, 77 y.o. female  Date of Admission:  07/08/2023  Date of Birth:  [birth year]  PCP: Vernell Barrier, PA-C    Reason for Consultation:  Epigastric upper abdominal pain    HPI:  MIYO DENHOLM is a 77 y.o. White female whom I was asked to see for consultation regarding epigastric upper abdominal pain which awakened her early in the morning.  The patient was nausea but no vomiting.  I had performed an EGD on her in the past few months which showed some gastritis.  Gallbladder ultrasound was normal and HIDA scan showed a normal ejection fraction of 57% although the patient did have some symptoms associated with abdominal discomfort and nausea during the cholecystokinin injection.  The patient states that her symptoms are worsened with eating.    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     Greater trochanteric bursitis of both hips     Mixed hyperlipidemia     Peripheral vascular disease, unspecified (CMS HCC)       Past Surgical History:   Procedure Laterality Date    BREAST SURGERY      tumor removal    ESOPHAGOGASTRODUODENOSCOPY      HX APPENDECTOMY      HX TUMOR REMOVAL      head    SHOULDER SURGERY      tumor removal      Social History     Tobacco Use    Smoking status: Every Day     Current packs/day: 1.00     Average packs/day: 1 pack/day for 40.0 years (40.0 ttl pk-yrs)     Types: Cigarettes    Smokeless tobacco: Never   Vaping Use    Vaping status: Never Used   Substance Use Topics    Alcohol use: Never    Drug use: Never       Family Medical History:       Problem Relation (Age of Onset)    Breast Cancer Sister    Dementia Father    Lung Cancer Sister    Parkinsons Disease Mother    Prostate Cancer Father           Medications Prior to Admission       Prescriptions    aspirin 81 mg Oral Tablet, Chewable    Chew 1 Tablet (81 mg total) Once a day     atorvastatin (LIPITOR) 20 mg Oral Tablet    TAKE 1 TABLET ONCE DAILY IN THE EVENING    diclofenac sodium (VOLTAREN) 75 mg Oral Tablet, Delayed Release (E.C.)    TAKE (1) TABLET TWICE A DAY.    ergocalciferol, vitamin D2, (DRISDOL) 1,250 mcg (50,000 unit) Oral Capsule    TAKE 1 CAPSULE ONCE A WEEK    ipratropium-albuterol 0.5 mg-3 mg(2.5 mg base)/3 mL Solution for Nebulization    Take 3 mL by nebulization Four times a day for 7 days    mupirocin (BACTROBAN) 2 % Ointment    Apply topically Twice daily    mupirocin (BACTROBAN) 2 % Ointment    Apply topically Three times a day    pregabalin (LYRICA) 75 mg Oral Capsule    Take 1 Capsule (75 mg total) by mouth Twice daily    RABEprazole (ACIPHEX) 20 mg Oral Tablet, Delayed Release (E.C.)  Take 1 Tablet (20 mg total) by mouth Once a day    sucralfate (CARAFATE) 1 gram Oral Tablet    Take 1 Tablet (1 g total) by mouth Twice a day before meals    traMADoL (ULTRAM) 50 mg Oral Tablet    Take 1 Tablet (50 mg total) by mouth Three times a day as needed for Pain           Allergies   Allergen Reactions    Bee Venom Protein (Honey Bee) Anaphylaxis    Bee Pollen           Patient Vitals for the past 24 hrs:   BP Temp Pulse Resp SpO2 Height Weight   07/08/23 0945 (!) 140/56 36.6 C (97.9 F) 88 20 94 % -- --   07/08/23 0930 -- -- -- -- (!) 89 % -- --   07/08/23 0915 -- -- -- -- 92 % -- --   07/08/23 0900 -- -- -- -- 90 % -- --   07/08/23 0845 -- -- -- -- (!) 88 % -- --   07/08/23 0830 -- -- -- -- 90 % -- --   07/08/23 0815 -- -- -- -- (!) 88 % -- --   07/08/23 0800 -- -- -- -- 90 % -- --   07/08/23 0745 -- -- -- -- 91 % -- --   07/08/23 0730 -- -- -- -- 93 % -- --   07/08/23 0715 -- -- -- -- 92 % -- --   07/08/23 0700 (!) 143/44 -- -- -- 96 % -- --   07/08/23 0645 (!) 180/69 -- -- -- 91 % -- --   07/08/23 0630 (!) 162/72 -- -- -- 91 % -- --   07/08/23 0615 (!) 153/72 -- -- -- -- -- --   07/08/23 0326 (!) 177/81 36.6 C (97.8 F) 92 20 98 % 1.575 m (5\' 2" ) 68.9 kg (152 lb)           General: appropriate for age. in no acute distress.    Vital signs are present above and have been reviewed by me     HEENT: Atraumatic, Normocephalic. PERRLA. EOMI. Nose clear. Throat clear    Lungs: Nonlabored breathing with symmetric expansion. Clear to auscultation bilaterally    Heart:Regular wth respect to rate and rythmn.    Abdomen:Soft.  Minimal discomfort to deep palpation in epigastrium but no rebound guarding or peritoneal signs. Nondistended and otherwise benign    Extremities: Grossly normal with no major deformities.    Neuro: Grossly normal motor and sensory function. CN's II through XII intact.    Psychiatric: Alert and oriented to person, place, and time. affect appropriate    Laboratory Data:     Results for orders placed or performed during the hospital encounter of 07/08/23 (from the past 24 hour(s))   COMPREHENSIVE METABOLIC PANEL, NON-FASTING   Result Value Ref Range    SODIUM 139 136 - 145 mmol/L    POTASSIUM 3.5 3.5 - 5.1 mmol/L    CHLORIDE 105 98 - 107 mmol/L    CO2 TOTAL 23 21 - 31 mmol/L    ANION GAP 11 4 - 13 mmol/L    BUN 17 7 - 25 mg/dL    CREATININE 5.40 9.81 - 1.30 mg/dL    BUN/CREA RATIO 26 (H) 6 - 22    ESTIMATED GFR 92 >59 mL/min/1.33m^2    ALBUMIN 4.0 3.5 - 5.7 g/dL    CALCIUM 9.5 8.6 - 19.1 mg/dL  GLUCOSE 118 (H) 74 - 109 mg/dL    ALKALINE PHOSPHATASE 63 34 - 104 U/L    ALT (SGPT) 13 7 - 52 U/L    AST (SGOT) 14 13 - 39 U/L    BILIRUBIN TOTAL 0.4 0.3 - 1.0 mg/dL    PROTEIN TOTAL 7.0 6.4 - 8.9 g/dL    ALBUMIN/GLOBULIN RATIO 1.3 0.8 - 1.4    OSMOLALITY, CALCULATED 280 270 - 290 mOsm/kg    CALCIUM, CORRECTED 9.5 8.9 - 10.8 mg/dL    GLOBULIN 3.0 2.9 - 5.4   LIPASE   Result Value Ref Range    LIPASE 33 11 - 82 U/L   CBC WITH DIFF   Result Value Ref Range    WBC 11.4 3.8 - 11.8 x10^3/uL    RBC 4.35 3.63 - 4.92 x10^6/uL    HGB 12.6 10.9 - 14.3 g/dL    HCT 65.7 84.6 - 96.2 %    MCV 86.3 75.5 - 95.3 fL    MCH 28.8 24.7 - 32.8 pg    MCHC 33.4 32.3 - 35.6 g/dL    RDW 95.2 84.1 -  32.4 %    PLATELETS 218 140 - 440 x10^3/uL    MPV 9.1 7.9 - 10.8 fL    NEUTROPHIL % 77 43 - 77 %    LYMPHOCYTE % 14 (L) 16 - 44 %    MONOCYTE % 7 5 - 13 %    EOSINOPHIL % 2 1 - 7 %    BASOPHIL % 1 0 - 1 %    NEUTROPHIL # 8.80 (H) 1.85 - 7.80 x10^3/uL    LYMPHOCYTE # 1.60 1.00 - 3.00 x10^3/uL    MONOCYTE # 0.80 0.30 - 1.00 x10^3/uL    EOSINOPHIL # 0.20 0.00 - 0.50 x10^3/uL    BASOPHIL # 0.10 0.00 - 0.10 x10^3/uL   TROPONIN-I   Result Value Ref Range    TROPONIN I 8 <15 ng/L   ECG 12 LEAD   Result Value Ref Range    Ventricular rate 83 BPM    Atrial Rate 83 BPM    PR Interval 200 ms    QRS Duration 134 ms    QT Interval 394 ms    QTC Calculation 462 ms    Calculated P Axis 65 degrees    Calculated R Axis 57 degrees    Calculated T Axis 66 degrees   LACTIC ACID LEVEL W/ REFLEX FOR LEVEL >2.0   Result Value Ref Range    LACTIC ACID 0.6 0.5 - 2.2 mmol/L       Imaging Studies:    CT ABDOMEN PELVIS W IV CONTRAST   Final Result by Edi, Radresults In (09/22 0714)   NO ACUTE FINDINGS AT THE ABDOMEN OR PELVIS ON CONTRAST-ENHANCED CT.          One or more dose reduction techniques were used (e.g., Automated exposure control, adjustment of the mA and/or kV according to patient size, use of iterative reconstruction technique).         Radiologist location ID: [id number]              Assessment/Plan:  There are no active hospital problems to display for this patient.      Upper abdominal pain with postprandial nausea    I had a lengthy discussion with the patient regarding her symptoms and the fact that all of her workup was essentially unremarkable except for symptom reproduction with HIDA scan and cholecystokinin injection.  I discussed with  her the fact that this may indeed be her gallbladder even though the ejection fraction of the gallbladder was 57% she had symptoms associated with the HIDA scan and it correlated with her typical symptomatology.    We discussed the options of proceeding with gallbladder surgery versus  further GI evaluation at a specialty center such as [facility].  I discussed with the patient that if on complete evaluation by the GI specialist there it was no definitive answer to her problem, then she can consider having her gallbladder removed.  She felt that she would like to proceed with this plan of evaluation/management.      I will have the patient follow up with her PCP who will in turn refer her to GI specialty clinic at [facility].    Diagnosis:  Persistent upper abdominal pain and nausea, postprandial in nature  This note was partially created using voice recognition software and is inherently subject to errors including those of syntax and "sound alike " substitutions which may escape proof reading. In such instances, original meaning may be extrapolated by contextual derivation.    Fidela Juneau, MD, MBA, FACS

## 2023-07-08 NOTE — Progress Notes (Signed)
Patient discharged from the ER this morning.  Reviewed medication list and schedule,  and reviewed the BRAT diet.  Questions answered and reinforced information on the AVS.

## 2023-07-08 NOTE — Discharge Instructions (Signed)
Continue all your normal medications as prescribed by your doctor   Dr. Nira Retort Duremdes is going to contact her primary care provider to suggest giving a referral to [facility] for Gastroenterology.  Return to the ER if there are any emergencies

## 2023-07-08 NOTE — ED Nurses Note (Signed)
Oriented times four. Discharge instructions reviewed with patient including new scripts. Instructed to return to ER with any complications. Patient verbalizes understanding of all information provided. Peripheral IV discontinued with catheter intact and pressure dressing applied. Tolerated well. PIV site WNL. Patient left this facility ambulatory and in care of self. Care relinquished at this time.

## 2023-07-08 NOTE — ED Triage Notes (Signed)
Epigastric/right sided abd pain woke her up at 0200 with pain denies n/v/d. States has been seeing dr Baldomero Lamy for this issue but pain got worse tonight.

## 2023-07-08 NOTE — ED Attending Handoff Note (Signed)
[facility]  Emergency Department  Course Note      Pamela Christensen is a 77 y.o. female who had concerns including Abdominal Pain.     I accepted this patient in transfer of care from Langley Adie, D.O.  at 0730 am.     I personally evaluated and examined the patient at the time of the visit.     Lab review: All labs reviewed by me at time of visit.   Results for orders placed or performed during the hospital encounter of 07/08/23 (from the past 24 hour(s))   CBC/DIFF    Narrative    The following orders were created for panel order CBC/DIFF.  Procedure                               Abnormality         Status                     ---------                               -----------         ------                     CBC WITH DIFF[[id number]]                Abnormal            Final result                 Please view results for these tests on the individual orders.   COMPREHENSIVE METABOLIC PANEL, NON-FASTING   Result Value Ref Range    SODIUM 139 136 - 145 mmol/L    POTASSIUM 3.5 3.5 - 5.1 mmol/L    CHLORIDE 105 98 - 107 mmol/L    CO2 TOTAL 23 21 - 31 mmol/L    ANION GAP 11 4 - 13 mmol/L    BUN 17 7 - 25 mg/dL    CREATININE 1.61 0.96 - 1.30 mg/dL    BUN/CREA RATIO 26 (H) 6 - 22    ESTIMATED GFR 92 >59 mL/min/1.94m^2    ALBUMIN 4.0 3.5 - 5.7 g/dL    CALCIUM 9.5 8.6 - 04.5 mg/dL    GLUCOSE 409 (H) 74 - 109 mg/dL    ALKALINE PHOSPHATASE 63 34 - 104 U/L    ALT (SGPT) 13 7 - 52 U/L    AST (SGOT) 14 13 - 39 U/L    BILIRUBIN TOTAL 0.4 0.3 - 1.0 mg/dL    PROTEIN TOTAL 7.0 6.4 - 8.9 g/dL    ALBUMIN/GLOBULIN RATIO 1.3 0.8 - 1.4    OSMOLALITY, CALCULATED 280 270 - 290 mOsm/kg    CALCIUM, CORRECTED 9.5 8.9 - 10.8 mg/dL    GLOBULIN 3.0 2.9 - 5.4    Narrative    Estimated Glomerular Filtration Rate (eGFR) is calculated using the CKD-EPI (2021) equation, intended for patients 53 years of age and older. If gender is not documented or "unknown", there will be no eGFR calculation.     LIPASE   Result Value  Ref Range    LIPASE 33 11 - 82 U/L   URINALYSIS, MACROSCOPIC AND MICROSCOPIC W/CULTURE REFLEX    Specimen: Urine, Site not specified    Narrative    The following orders were  created for panel order URINALYSIS, MACROSCOPIC AND MICROSCOPIC W/CULTURE REFLEX.  Procedure                               Abnormality         Status                     ---------                               -----------         ------                     URINALYSIS, MACROSCOPIC[[id number]]                                                     URINALYSIS, MICROSCOPIC[[id number]]                                                       Please view results for these tests on the individual orders.   CBC WITH DIFF   Result Value Ref Range    WBC 11.4 3.8 - 11.8 x10^3/uL    RBC 4.35 3.63 - 4.92 x10^6/uL    HGB 12.6 10.9 - 14.3 g/dL    HCT 47.8 29.5 - 62.1 %    MCV 86.3 75.5 - 95.3 fL    MCH 28.8 24.7 - 32.8 pg    MCHC 33.4 32.3 - 35.6 g/dL    RDW 30.8 65.7 - 84.6 %    PLATELETS 218 140 - 440 x10^3/uL    MPV 9.1 7.9 - 10.8 fL    NEUTROPHIL % 77 43 - 77 %    LYMPHOCYTE % 14 (L) 16 - 44 %    MONOCYTE % 7 5 - 13 %    EOSINOPHIL % 2 1 - 7 %    BASOPHIL % 1 0 - 1 %    NEUTROPHIL # 8.80 (H) 1.85 - 7.80 x10^3/uL    LYMPHOCYTE # 1.60 1.00 - 3.00 x10^3/uL    MONOCYTE # 0.80 0.30 - 1.00 x10^3/uL    EOSINOPHIL # 0.20 0.00 - 0.50 x10^3/uL    BASOPHIL # 0.10 0.00 - 0.10 x10^3/uL   TROPONIN-I   Result Value Ref Range    TROPONIN I 8 <15 ng/L   LACTIC ACID LEVEL W/ REFLEX FOR LEVEL >2.0   Result Value Ref Range    LACTIC ACID 0.6 0.5 - 2.2 mmol/L        Radiology Review: All radiologic testing viewed by me at the time of visit.   CT ABDOMEN PELVIS W IV CONTRAST   Final Result by Edi, Radresults In (09/22 0714)   NO ACUTE FINDINGS AT THE ABDOMEN OR PELVIS ON CONTRAST-ENHANCED CT.          One or more dose reduction techniques were used (e.g., Automated exposure control, adjustment of the mA and/or kV according to patient size, use of iterative reconstruction technique).          Radiologist location ID: [id number]  Course:   Medical Decision Making  Dr. Hyacinth Meeker and I discussed the case at length.  I did discuss the case also with the patient's surgeon, Dr. Nira Retort Duremdes.  Dr. Nira Retort Duremdes looked over the CT scan himself and also came to talk to the patient.  He gave her the option of having her gallbladder taken out she had pain with CCK administration.  He also offered to send her to gastroenterology.  She was chosen to seek help with Gastroenterology at [facility].  Therefore, Dr. Nira Retort Duremdes is going to contact the patient's primary care provider and suggest this information.  No further treatment plan changes are made at this time.  Patient was wanting to go home now    Problems Addressed:  Abdominal pain, unspecified abdominal location: chronic illness or injury with exacerbation, progression, or side effects of treatment    Amount and/or Complexity of Data Reviewed  Labs: ordered. Decision-making details documented in ED Course.  Radiology: ordered and independent interpretation performed. Decision-making details documented in ED Course.  ECG/medicine tests: ordered. Decision-making details documented in ED Course.     Details: RBBB otherwise, WNL           Clinical Impression   Abdominal pain, unspecified abdominal location (Primary)       Following the history, physical exam, and ED workup, the patient was deemed stable and suitable for discharge. The patient/caregiver was advised to return to the ED for any new or worsening symptoms. Discharge medications, and follow-up instructions were discussed with the patient/caregiver in detail, who verbalizes understanding. The patient/caregiver is in agreement and is comfortable with the plan of care.    Disposition: Discharged         Current Discharge Medication List        CONTINUE these medications - NO CHANGES were made during your visit.        Details   aspirin 81 mg Tablet, Chewable   81 mg,  Oral, DAILY  Refills: 0     atorvastatin 20 mg Tablet  Commonly known as: LIPITOR   TAKE 1 TABLET ONCE DAILY IN THE EVENING  Qty: 90 Tablet  Refills: 1     diclofenac sodium 75 mg Tablet, Delayed Release (E.C.)  Commonly known as: VOLTAREN   TAKE (1) TABLET TWICE A DAY.  Qty: 60 Tablet  Refills: 1     ergocalciferol (vitamin D2) 1,250 mcg (50,000 unit) Capsule  Commonly known as: DRISDOL   50,000 Units, Oral, EVERY 7 DAYS  Qty: 13 Capsule  Refills: 1     ipratropium-albuteroL 0.5 mg-3 mg(2.5 mg base)/3 mL nebulizer solution  Commonly known as: DUONEB   3 mL, Nebulization, 4 TIMES DAILY  Qty: 28 Each  Refills: 0     * mupirocin 2 % Ointment  Commonly known as: BACTROBAN   Apply Topically, 2 TIMES DAILY  Qty: 22 g  Refills: 1     * mupirocin 2 % Ointment  Commonly known as: BACTROBAN   Apply Topically, 3 TIMES DAILY  Qty: 1 Each  Refills: 1     pregabalin 75 mg Capsule  Commonly known as: LYRICA   75 mg, Oral, 2 TIMES DAILY  Qty: 180 Capsule  Refills: 0     RABEprazole 20 mg Tablet, Delayed Release (E.C.)  Commonly known as: ACIPHEX   20 mg, Oral, DAILY  Refills: 0     sucralfate 1 gram Tablet  Commonly known as: CARAFATE   1 g, Oral, 2 TIMES DAILY  BEFORE MEALS  Qty: 60 Tablet  Refills: 3     traMADoL 50 mg Tablet  Commonly known as: ULTRAM   50 mg, Oral, 3 TIMES DAILY PRN  Qty: 90 Tablet  Refills: 1           * This list has 2 medication(s) that are the same as other medications prescribed for you. Read the directions carefully, and ask your doctor or other care provider to review them with you.                Follow up:   Vernell Barrier, PA-C  [address]  [place] [id number]  [phone]    In 1 week              Alphonzo Cruise, DO

## 2023-07-08 NOTE — ED Provider Notes (Signed)
[facility]  Emergency Department  Attending Provider Note      CHIEF COMPLAINT  Chief Complaint   Patient presents with    Abdominal Pain     HISTORY OF PRESENT ILLNESS  Pamela Christensen, date of birth 1946/08/29, is a 77 y.o. female who presented to the Emergency Department it was severe upper abdominal pain.  The patient states she has some nausea but no vomiting.  She states she has been having intermittent abdominal pain over the past few weeks.  She states the pain is worse with eating.  No fever.  Denies bloody stools.  She states she was recently had an ultrasound the gallbladder, HIDA scan, and an EGD.  None of these reveal any abnormalities.    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     Greater trochanteric bursitis of both hips     Mixed hyperlipidemia     Peripheral vascular disease, unspecified (CMS HCC)        Past Surgical History:   Procedure Laterality Date    BREAST SURGERY      tumor removal    ESOPHAGOGASTRODUODENOSCOPY      HX APPENDECTOMY      HX TUMOR REMOVAL      head    SHOULDER SURGERY      tumor removal       Family Medical History:       Problem Relation (Age of Onset)    Breast Cancer Sister    Dementia Father    Lung Cancer Sister    Parkinsons Disease Mother    Prostate Cancer Father          Social History     Socioeconomic History    Marital status: Divorced   Tobacco Use    Smoking status: Every Day     Current packs/day: 1.00     Average packs/day: 1 pack/day for 40.0 years (40.0 ttl pk-yrs)     Types: Cigarettes    Smokeless tobacco: Never   Vaping Use    Vaping status: Never Used   Substance and Sexual Activity    Alcohol use: Never    Drug use: Never      ALLERGIES  Allergies   Allergen Reactions    Bee Venom Protein (Honey Bee) Anaphylaxis    Bee Pollen        PHYSICAL EXAM  VITAL SIGNS:  Filed Vitals:    07/08/23 0326 07/08/23 0615 07/08/23 0630   BP: (!) 177/81  (!) 153/72 (!) 162/72   Pulse: 92     Resp: 20     Temp: 36.6 C (97.8 F)     SpO2: 98%  91%     GENERAL: PATIENT IS ALERT AND ORIENTED TO PERSON, PLACE, AND TIME.  HEAD: NORMOCEPHALIC AND ATRAUMATIC.  EYES: PUPILS EQUALLY ROUND AND REACT TO LIGHT. EXTRAOCULAR MOVEMENTS INTACT.  EARS: GROSS HEARING INTACT. EXTERNAL EARS WITHIN NORMAL LIMITS.  NOSE: NO SEPTAL DEVIATION. NASAL PASSAGES CLEAR.  MOUTH:  NORMAL DENTITION. MOIST ORAL MUCOSA.  THROAT:  NO ERYTHEMA OR EXUDATE OF THE PHARYNX.  NECK: SUPPLE. TRACHEA MIDLINE.  CARDIOVASCULAR: REGULAR RATE, AND RHYTHM. NO MURMUR.  LUNGS: CLEAR TO AUSCULTATION BILATERAL.  ABDOMEN: SOFT, NON-DISTENDED, AND BOWEL SOUNDS ARE PRESENT.  Epigastric tenderness  GENITOURINARY: DEFERRED.  RECTAL: DEFERRED.  EXTREMITIES: NO CYANOSIS, CLUBBING, OR EDEMA.  SKIN: WARM AND DRY.  NEUROLOGIC: CRANIAL NERVES  II THROUGH XII ARE GROSSLY INTACT. MOVES ALL 4 EXTREMITIES.  PSYCHIATRIC: JUDGMENT AND INSIGHT ARE SEEMINGLY INTACT. MOOD AND AFFECT ARE APPROPRIATE FOR THE SITUATION.    DIAGNOSTICS  Labs:  Labs listed below were reviewed and interpreted by me.  Results for orders placed or performed during the hospital encounter of 07/08/23   COMPREHENSIVE METABOLIC PANEL, NON-FASTING   Result Value Ref Range    SODIUM 139 136 - 145 mmol/L    POTASSIUM 3.5 3.5 - 5.1 mmol/L    CHLORIDE 105 98 - 107 mmol/L    CO2 TOTAL 23 21 - 31 mmol/L    ANION GAP 11 4 - 13 mmol/L    BUN 17 7 - 25 mg/dL    CREATININE 6.38 7.56 - 1.30 mg/dL    BUN/CREA RATIO 26 (H) 6 - 22    ESTIMATED GFR 92 >59 mL/min/1.54m^2    ALBUMIN 4.0 3.5 - 5.7 g/dL    CALCIUM 9.5 8.6 - 43.3 mg/dL    GLUCOSE 295 (H) 74 - 109 mg/dL    ALKALINE PHOSPHATASE 63 34 - 104 U/L    ALT (SGPT) 13 7 - 52 U/L    AST (SGOT) 14 13 - 39 U/L    BILIRUBIN TOTAL 0.4 0.3 - 1.0 mg/dL    PROTEIN TOTAL 7.0 6.4 - 8.9 g/dL    ALBUMIN/GLOBULIN RATIO 1.3 0.8 - 1.4    OSMOLALITY, CALCULATED 280 270 - 290 mOsm/kg    CALCIUM, CORRECTED 9.5 8.9 - 10.8 mg/dL    GLOBULIN 3.0 2.9 -  5.4   LIPASE   Result Value Ref Range    LIPASE 33 11 - 82 U/L   CBC WITH DIFF   Result Value Ref Range    WBC 11.4 3.8 - 11.8 x10^3/uL    RBC 4.35 3.63 - 4.92 x10^6/uL    HGB 12.6 10.9 - 14.3 g/dL    HCT 18.8 41.6 - 60.6 %    MCV 86.3 75.5 - 95.3 fL    MCH 28.8 24.7 - 32.8 pg    MCHC 33.4 32.3 - 35.6 g/dL    RDW 30.1 60.1 - 09.3 %    PLATELETS 218 140 - 440 x10^3/uL    MPV 9.1 7.9 - 10.8 fL    NEUTROPHIL % 77 43 - 77 %    LYMPHOCYTE % 14 (L) 16 - 44 %    MONOCYTE % 7 5 - 13 %    EOSINOPHIL % 2 1 - 7 %    BASOPHIL % 1 0 - 1 %    NEUTROPHIL # 8.80 (H) 1.85 - 7.80 x10^3/uL    LYMPHOCYTE # 1.60 1.00 - 3.00 x10^3/uL    MONOCYTE # 0.80 0.30 - 1.00 x10^3/uL    EOSINOPHIL # 0.20 0.00 - 0.50 x10^3/uL    BASOPHIL # 0.10 0.00 - 0.10 x10^3/uL   TROPONIN-I   Result Value Ref Range    TROPONIN I 8 <15 ng/L   ECG 12 LEAD   Result Value Ref Range    Ventricular rate 83 BPM    Atrial Rate 83 BPM    PR Interval 200 ms    QRS Duration 134 ms    QT Interval 394 ms    QTC Calculation 462 ms    Calculated P Axis 65 degrees    Calculated R Axis 57 degrees    Calculated T Axis 66 degrees     Radiology:  Results for orders placed or performed during the hospital encounter of 07/08/23  CT ABDOMEN PELVIS W IV CONTRAST     Status: None    Narrative    Pamela Christensen    RADIOLOGIST: Marcelyn Ditty, MD    CT ABDOMEN PELVIS W IV CONTRAST performed on 07/08/2023 6:58 AM    CLINICAL HISTORY: severe upper abdominal pain.  severe upper abdominal pain, nausea hx. of appendectomy    TECHNIQUE:  Abdomen and pelvis CT with intravenous contrast.  IV CONTRAST: 75 ml's of Omnipaque 350    COMPARISON:  December 2023    FINDINGS:  Lung bases: Mild basilar atelectasis/scarring    Liver:   Unremarkable.    Gallbladder:   Unremarkable.    Spleen:   Unremarkable.    Pancreas:   Unremarkable.    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    Uterus and Adnexa:  Unremarkable.    Bowel:   Unremarkable.    Appendix:  No sign of appendicitis    Lymph nodes:  No suspicious lymph node enlargement.    Vasculature:   Major visceral vasculature are grossly patent  Extensive calcified atherosclerotic plaque aortoiliac segments       Peritoneum / Retroperitoneum: No free fluid. No free air. No focal fluid collections    Bones:   No acute skeletal findings  Advanced lumbar degenerative disease        Impression    NO ACUTE FINDINGS AT THE ABDOMEN OR PELVIS ON CONTRAST-ENHANCED CT.       One or more dose reduction techniques were used (e.g., Automated exposure control, adjustment of the mA and/or kV according to patient size, use of iterative reconstruction technique).      Radiologist location ID: [id number]         ED COURSE/MEDICAL DECISION MAKING  Medications Administered in the ED   NS bolus infusion 1,000 mL (0 mL Intravenous Stopped 07/08/23 0543)   pantoprazole (PROTONIX) 4 mg/mL injection (40 mg Intravenous Given 07/08/23 0443)   aluminum-magnesium hydroxide-simethicone (MAG-AL PLUS) 200-200-20 mg per 5 mL oral liquid (30 mL Oral Given 07/08/23 0550)     And   hyoscyamine (HYOSYNE) 0.125 mg per 5 mL oral liquid (10 mL Oral Given 07/08/23 0550)     And   lidocaine (XYLOCAINE) 2% oral topical viscous solution (15 mL Oral Given 07/08/23 0550)   iohexol (OMNIPAQUE 350) infusion (75 mL Intravenous Given 07/08/23 0700)          Medical Decision Making  The patient presents to the ED with upper abdominal pain.  Labs were collected and are unremarkable.  Lactate was added on after initial lab draw.  CT was performed that shows significant calcification of the aorta and superior mesenteric artery.  The patient's care was endorsed to Dr. Katrinka Blazing at shift change          CLINICAL IMPRESSION  Clinical Impression   Abdominal pain, unspecified abdominal location (Primary)     DISPOSITION  Data  Unavailable       DISCHARGE MEDICATIONS  Current Discharge Medication List          Madelaine Bhat Hyacinth Meeker D.O.   07/08/2023, 05:16   [facility]  Department of Emergency Medicine  [facility]    Contents of the document, in whole or in  part, are completed utilizing M*Modal dictation technology, please forgive any typographical errors that may exist.   -----

## 2023-07-09 DIAGNOSIS — I4510 Unspecified right bundle-branch block: Secondary | ICD-10-CM

## 2023-07-09 DIAGNOSIS — R9431 Abnormal electrocardiogram [ECG] [EKG]: Secondary | ICD-10-CM

## 2023-07-09 LAB — ECG 12 LEAD
Atrial Rate: 83 {beats}/min
Calculated P Axis: 65 degrees
Calculated R Axis: 57 degrees
Calculated T Axis: 66 degrees
PR Interval: 200 ms
QRS Duration: 134 ms
QT Interval: 394 ms
QTC Calculation: 462 ms
Ventricular rate: 83 {beats}/min

## 2023-07-10 ENCOUNTER — Encounter (INDEPENDENT_AMBULATORY_CARE_PROVIDER_SITE_OTHER): Payer: Self-pay | Admitting: Surgery

## 2023-09-06 ENCOUNTER — Emergency Department
Admission: EM | Admit: 2023-09-06 | Discharge: 2023-09-06 | Discharge status: Against Medical Advice | Payer: Medicare Other | Attending: Family | Admitting: Family

## 2023-09-06 ENCOUNTER — Other Ambulatory Visit: Payer: Self-pay

## 2023-09-06 ENCOUNTER — Telehealth (INDEPENDENT_AMBULATORY_CARE_PROVIDER_SITE_OTHER): Payer: Self-pay | Admitting: Surgery

## 2023-09-06 DIAGNOSIS — R634 Abnormal weight loss: Secondary | ICD-10-CM

## 2023-09-06 DIAGNOSIS — R197 Diarrhea, unspecified: Secondary | ICD-10-CM

## 2023-09-06 DIAGNOSIS — R509 Fever, unspecified: Secondary | ICD-10-CM

## 2023-09-06 DIAGNOSIS — R1011 Right upper quadrant pain: Secondary | ICD-10-CM | POA: Insufficient documentation

## 2023-09-06 DIAGNOSIS — G8929 Other chronic pain: Secondary | ICD-10-CM | POA: Insufficient documentation

## 2023-09-06 DIAGNOSIS — Z5329 Procedure and treatment not carried out because of patient's decision for other reasons: Secondary | ICD-10-CM | POA: Insufficient documentation

## 2023-09-06 DIAGNOSIS — R1110 Vomiting, unspecified: Secondary | ICD-10-CM

## 2023-09-06 LAB — CBC WITH DIFF
BASOPHIL #: 0.1 10*3/uL (ref 0.00–0.10)
BASOPHIL %: 1 % (ref 0–1)
EOSINOPHIL #: 0.1 10*3/uL (ref 0.00–0.50)
EOSINOPHIL %: 1 % (ref 1–7)
HCT: 38.5 % (ref 31.2–41.9)
HGB: 12.8 g/dL (ref 10.9–14.3)
LYMPHOCYTE #: 2.2 10*3/uL (ref 1.00–3.00)
LYMPHOCYTE %: 16 % (ref 16–44)
MCH: 28.7 pg (ref 24.7–32.8)
MCHC: 33.4 g/dL (ref 32.3–35.6)
MCV: 85.9 fL (ref 75.5–95.3)
MONOCYTE #: 1.1 10*3/uL — ABNORMAL HIGH (ref 0.30–1.00)
MONOCYTE %: 8 % (ref 5–13)
MPV: 9.2 fL (ref 7.9–10.8)
NEUTROPHIL #: 10 10*3/uL — ABNORMAL HIGH (ref 1.85–7.80)
NEUTROPHIL %: 74 % (ref 43–77)
PLATELETS: 251 10*3/uL (ref 140–440)
RBC: 4.48 10*6/uL (ref 3.63–4.92)
RDW: 16.9 % (ref 12.3–17.7)
WBC: 13.5 10*3/uL — ABNORMAL HIGH (ref 3.8–11.8)

## 2023-09-06 LAB — GOLD TOP TUBE

## 2023-09-06 LAB — COMPREHENSIVE METABOLIC PANEL, NON-FASTING
ALBUMIN/GLOBULIN RATIO: 1.4 (ref 0.8–1.4)
ALBUMIN: 4.4 g/dL (ref 3.5–5.7)
ALKALINE PHOSPHATASE: 87 U/L (ref 34–104)
ALT (SGPT): 12 U/L (ref 7–52)
ANION GAP: 10 mmol/L (ref 4–13)
AST (SGOT): 13 U/L (ref 13–39)
BILIRUBIN TOTAL: 0.5 mg/dL (ref 0.3–1.0)
BUN/CREA RATIO: 25 — ABNORMAL HIGH (ref 6–22)
BUN: 20 mg/dL (ref 7–25)
CALCIUM, CORRECTED: 9.6 mg/dL (ref 8.9–10.8)
CALCIUM: 9.9 mg/dL (ref 8.6–10.3)
CHLORIDE: 105 mmol/L (ref 98–107)
CO2 TOTAL: 23 mmol/L (ref 21–31)
CREATININE: 0.79 mg/dL (ref 0.60–1.30)
ESTIMATED GFR: 78 mL/min/{1.73_m2} (ref >59)
GLOBULIN: 3.1 (ref 2.0–3.5)
GLUCOSE: 113 mg/dL — ABNORMAL HIGH (ref 74–109)
OSMOLALITY, CALCULATED: 279 mosm/kg (ref 270–290)
POTASSIUM: 4 mmol/L (ref 3.5–5.1)
PROTEIN TOTAL: 7.5 g/dL (ref 6.4–8.9)
SODIUM: 138 mmol/L (ref 136–145)

## 2023-09-06 LAB — LACTIC ACID LEVEL W/ REFLEX FOR LEVEL >2.0: LACTIC ACID: 1.1 mmol/L (ref 0.5–2.2)

## 2023-09-06 LAB — LIPASE: LIPASE: 28 U/L (ref 11–82)

## 2023-09-06 LAB — BLUE TOP TUBE

## 2023-09-06 NOTE — Telephone Encounter (Signed)
Patient is still complaining of abdominal pain. LOV 02/27/23. Says she has lost 20 lbs because she can't eat. Was instructed to call office if shoe could not tolerate the pain. Please advise.  Meriam Sprague, LPN  28/41/3244 09:00

## 2023-09-06 NOTE — ED Provider Notes (Signed)
[facility] - Emergency Department  ED Primary Provider Note  History of Present Illness   Chief Complaint   Patient presents with    Chronic Pain     Pamela Christensen is a 77 y.o. female who had concerns including Chronic Pain.  Arrival: The patient arrived by Car    Patient is female to the emergency department complaining of right upper quadrant pain.  Patient states she sees Dr.Duremedes who has performed HIDA scan.  Patient was told her HIDA scan was abnormal and to follow up at [facility] for further investigation according to patient.  Patient states her appointment is in February.  Patient states right upper quadrant pain today.  Patient has a has a vomiting diarrhea fever.  Patient states she would like further investigation done for of her right upper quadrant pain.            History Reviewed This Encounter:     Physical Exam   ED Triage Vitals [09/06/23 1421]   BP (Non-Invasive) (!) 142/72   Heart Rate 84   Respiratory Rate 16   Temperature 36.4 C (97.6 F)   SpO2 99 %   Weight 70.3 kg (155 lb)   Height      Physical Exam  Vitals and nursing note reviewed.   Constitutional:       General: She is not in acute distress.     Appearance: She is well-developed.   HENT:      Head: Normocephalic and atraumatic.   Eyes:      Conjunctiva/sclera: Conjunctivae normal.   Cardiovascular:      Rate and Rhythm: Normal rate and regular rhythm.      Heart sounds: No murmur heard.  Pulmonary:      Effort: Pulmonary effort is normal. No respiratory distress.      Breath sounds: Normal breath sounds.   Abdominal:      Palpations: Abdomen is soft.      Tenderness: There is no abdominal tenderness.   Musculoskeletal:         General: No swelling.      Cervical back: Neck supple.   Skin:     General: Skin is warm and dry.      Capillary Refill: Capillary refill takes less than 2 seconds.   Neurological:      Mental Status: She is alert.   Psychiatric:         Mood and Affect: Mood normal.       Patient Data     Labs  Ordered/Reviewed   COMPREHENSIVE METABOLIC PANEL, NON-FASTING - Abnormal; Notable for the following components:       Result Value    BUN/CREA RATIO 25 (*)     GLUCOSE 113 (*)     All other components within normal limits    Narrative:     Estimated Glomerular Filtration Rate (eGFR) is calculated using the CKD-EPI (2021) equation, intended for patients 49 years of age and older. If gender is not documented or "unknown", there will be no eGFR calculation.     CBC WITH DIFF - Abnormal; Notable for the following components:    WBC 13.5 (*)     NEUTROPHIL # 10.00 (*)     MONOCYTE # 1.10 (*)     All other components within normal limits   LACTIC ACID LEVEL W/ REFLEX FOR LEVEL >2.0 - Normal   LIPASE - Normal   CBC/DIFF    Narrative:     The  following orders were created for panel order CBC/DIFF.  Procedure                               Abnormality         Status                     ---------                               -----------         ------                     CBC WITH ZOXW[[id number]]                Abnormal            Final result                 Please view results for these tests on the individual orders.   URINALYSIS, MACROSCOPIC AND MICROSCOPIC W/CULTURE REFLEX    Narrative:     The following orders were created for panel order URINALYSIS, MACROSCOPIC AND MICROSCOPIC W/CULTURE REFLEX [PRN ONLY].  Procedure                               Abnormality         Status                     ---------                               -----------         ------                     URINALYSIS, MACROSCOPIC[[id number]]                                                     URINALYSIS, MICROSCOPIC[[id number]]                                                       Please view results for these tests on the individual orders.   URINALYSIS, MACROSCOPIC   URINALYSIS, MICROSCOPIC   EXTRA TUBES    Narrative:     The following orders were created for panel order EXTRA TUBES.  Procedure                               Abnormality          Status                     ---------                               -----------         ------  BLUE TOP YQIH[[id number]]                                    In process                 GOLD TOP OVFI[[id number]]                                    In process                   Please view results for these tests on the individual orders.   BLUE TOP TUBE   GOLD TOP TUBE     No orders to display     Medical Decision Making      Medical Decision Making  Patient is female to the emergency department complaining of right upper quadrant pain.  Patient states she sees Dr.Duremedes who has performed HIDA scan.  Patient was told her HIDA scan was abnormal and to follow up at [facility] for further investigation according to patient.  Patient states her appointment is in February.  Patient states right upper quadrant pain today.  Patient has a has a vomiting diarrhea fever.  Patient states she would like further investigation done for of her right upper quadrant pain.    On physical examination patient does have positive Murphy's sign.  Patient abdomen otherwise nontender to palpation.  Normoactive bowel sounds noted.  Patient is afebrile and vital signs stable.  CBC CMP lactic urinalysis and CT abdomen and pelvis with IV contrast use for evaluation.  Patient left against medical advice prior to results noted.    Amount and/or Complexity of Data Reviewed  Labs: ordered.  Radiology: ordered.                   Clinical Impression   RUQ pain (Primary)       Disposition: Eloped

## 2023-09-06 NOTE — ED APP Handoff Note (Signed)
[facility] - Emergency Department  Emergency Department  Provider in Triage Note    Name: Pamela Christensen  Age: 77 y.o.  Gender: female     Subjective:   Pamela Christensen is a 77 y.o. female who presents with complaint of Chronic Pain  .  Pt c/o RUQ pain with weight loss. Pt sees Dr. Baldomero Lamy and had HIDA scan. Unknown results.     Objective:   Filed Vitals:    09/06/23 1421   BP: (!) 142/72   Pulse: 84   Resp: 16   Temp: 36.4 C (97.6 F)   SpO2: 99%      Focused Physical Exam shows RUQ pain with palpation.    Plan:  Please see initial orders and work-up below.  This is to be continued with full evaluation in the main Emergency Department.     No current facility-administered medications for this encounter.     Results for orders placed or performed during the hospital encounter of 09/06/23 (from the past 24 hour(s))   CBC/DIFF    Narrative    The following orders were created for panel order CBC/DIFF.  Procedure                               Abnormality         Status                     ---------                               -----------         ------                     CBC WITH ONGE[[id number]]                                                                 Please view results for these tests on the individual orders.   URINALYSIS, MACROSCOPIC AND MICROSCOPIC W/CULTURE REFLEX [PRN ONLY]    Specimen: Urine, Clean Catch    Narrative    The following orders were created for panel order URINALYSIS, MACROSCOPIC AND MICROSCOPIC W/CULTURE REFLEX [PRN ONLY].  Procedure                               Abnormality         Status                     ---------                               -----------         ------                     URINALYSIS, MACROSCOPIC[[id number]]  URINALYSIS, MICROSCOPIC[[id number]]                                                       Please view results for these tests on the individual orders.

## 2023-09-06 NOTE — ED Triage Notes (Signed)
C/O CHRONIC R FLANK PAIN. SEE DR. GENE FOR THIS PROBLEM. HIDA SCAN COMPLETED.

## 2023-09-17 NOTE — Telephone Encounter (Signed)
Patient has already went to see GI at [facility]. Says she has another appointment on 09/27/23. Per patient, she was wanting to see if there was anything you could do before she went to the specialist.   Pamela Sprague, LPN  95/03/2129 86:57

## 2023-09-19 NOTE — Telephone Encounter (Signed)
Patient notified. Voiced understanding.   Meriam Sprague, LPN  19/10/4780 95:62

## 2023-09-20 ENCOUNTER — Ambulatory Visit (INDEPENDENT_AMBULATORY_CARE_PROVIDER_SITE_OTHER): Payer: Self-pay | Admitting: OTOLARYNGOLOGY

## 2023-09-24 ENCOUNTER — Other Ambulatory Visit: Payer: Medicare Other | Attending: PHYSICIAN ASSISTANT

## 2023-09-24 ENCOUNTER — Other Ambulatory Visit: Payer: Self-pay

## 2023-09-24 DIAGNOSIS — R109 Unspecified abdominal pain: Secondary | ICD-10-CM | POA: Insufficient documentation

## 2023-09-27 LAB — HELICOBACTER PYLORI AG, BY EIA: H. PYLORI ANTIGEN: NOT DETECTED

## 2023-10-04 ENCOUNTER — Encounter (INDEPENDENT_AMBULATORY_CARE_PROVIDER_SITE_OTHER): Payer: Medicare Other | Admitting: Surgery

## 2023-10-04 ENCOUNTER — Encounter (INDEPENDENT_AMBULATORY_CARE_PROVIDER_SITE_OTHER): Payer: Self-pay | Admitting: Surgery

## 2023-10-25 ENCOUNTER — Encounter (INDEPENDENT_AMBULATORY_CARE_PROVIDER_SITE_OTHER): Payer: Medicare Other | Admitting: Surgery

## 2023-10-30 ENCOUNTER — Other Ambulatory Visit: Payer: Self-pay

## 2023-10-30 ENCOUNTER — Ambulatory Visit (INDEPENDENT_AMBULATORY_CARE_PROVIDER_SITE_OTHER): Payer: Medicare Other | Admitting: Surgery

## 2023-10-30 ENCOUNTER — Encounter (INDEPENDENT_AMBULATORY_CARE_PROVIDER_SITE_OTHER): Payer: Self-pay | Admitting: Surgery

## 2023-10-30 VITALS — BP 132/80 | HR 74 | Temp 97.7°F | Ht 62.0 in | Wt 142.0 lb

## 2023-10-30 DIAGNOSIS — K551 Chronic vascular disorders of intestine: Secondary | ICD-10-CM

## 2023-10-30 DIAGNOSIS — R1013 Epigastric pain: Secondary | ICD-10-CM

## 2023-10-30 DIAGNOSIS — I774 Celiac artery compression syndrome: Secondary | ICD-10-CM

## 2023-10-30 NOTE — Progress Notes (Signed)
GENERAL SURGERY, [facility]  [address]  [place] [address]    Progress Note    Name: Pamela Christensen MRN:  [id number]   Date: 10/30/2023 DOB:  Dec 12, 1945 (78 y.o.)                Date of Service:  10/30/2023  Pamela Christensen, 78 y.o. female  Date of Birth:  10/10/1946  PCP: Vernell Barrier, PA-C  Referring:  Vernell Barrier     HPI:  Pamela Christensen is a 78 y.o. White female who returns for follow up visit after being seen and evaluated at [facility].  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     Greater trochanteric bursitis of both hips     Mixed hyperlipidemia     Peripheral vascular disease, unspecified (CMS HCC)       Past Surgical History:   Procedure Laterality Date    BREAST SURGERY      tumor removal    ESOPHAGOGASTRODUODENOSCOPY      HX APPENDECTOMY      HX TUMOR REMOVAL      head    SHOULDER SURGERY      tumor removal      Outpatient Medications Marked as Taking for the 10/30/23 encounter (Office Visit) with Sadler Teschner, Cherylann Parr, MD   Medication Sig    aspirin 81 mg Oral Tablet, Chewable Chew 1 Tablet (81 mg total) Once a day    atorvastatin (LIPITOR) 20 mg Oral Tablet TAKE 1 TABLET ONCE DAILY IN THE EVENING    cloNIDine HCL (CATAPRES) 0.1 mg Oral Tablet Take 1 Tablet (0.1 mg total) by mouth Once per day as needed for Hypertension    ergocalciferol, vitamin D2, (DRISDOL) 1,250 mcg (50,000 unit) Oral Capsule TAKE 1 CAPSULE ONCE A WEEK    famotidine (PEPCID) 20 mg Oral Tablet Take 1 Tablet (20 mg total) by mouth Twice daily    losartan (COZAAR) 25 mg Oral Tablet Take 1 Tablet (25 mg total) by mouth Once a day    pregabalin (LYRICA) 75 mg Oral Capsule Take 1 Capsule (75 mg total) by mouth Twice  daily    traMADoL (ULTRAM) 50 mg Oral Tablet Take 1 Tablet (50 mg total) by mouth Three times a day as needed for Pain      Allergies   Allergen Reactions    Bee Venom Protein (Honey Bee) Anaphylaxis    Bee Pollen            BP 132/80   Pulse 74   Temp 36.5 C (97.7 F)   Ht 1.575 m (5\' 2" )   Wt 64.4 kg (142 lb)   SpO2 97%   BMI 25.97 kg/m          General: appropriate for age. in no acute distress.    Vital signs are present above and have been reviewed by me     HEENT: Atraumatic, Normocephalic.    Lungs: Nonlabored breathing with symmetric expansion    Heart:Regular wth respect to rate and rythmn.    Abdomen:Soft. Nontender. Nondistended and otherwise benign    Psychiatric: Alert and oriented to  person, place, and time. affect appropriate       Assessment/Plan:  Assessment/Plan   1. Epigastric pain         Celiac and mesenteric artery stenoses  The patient will be seeing a vascular surgical specialist at [facility] on November 14, 2023.  The patient will come back to see me p.r.n. and it was very appreciative for the referral.    Return if symptoms worsen or fail to improve.     This note was partially created using voice recognition software and is inherently subject to errors including those of syntax and "sound alike " substitutions which may escape proof reading. In such instances, original meaning may be extrapolated by contextual derivation.    Chae Shuster B Andrey Mccaskill, MD,MBA,FACS

## 2024-03-04 ENCOUNTER — Encounter (INDEPENDENT_AMBULATORY_CARE_PROVIDER_SITE_OTHER): Payer: Self-pay
